# Patient Record
Sex: Male | Born: 1945
Health system: Southern US, Community
[De-identification: ages and names within clinical notes are randomized; demographics above are authoritative.]

## PROBLEM LIST (undated history)

## (undated) DIAGNOSIS — K219 Gastro-esophageal reflux disease without esophagitis: Secondary | ICD-10-CM

## (undated) DIAGNOSIS — N486 Induration penis plastica: Secondary | ICD-10-CM

## (undated) DIAGNOSIS — N529 Male erectile dysfunction, unspecified: Secondary | ICD-10-CM

## (undated) DIAGNOSIS — C439 Malignant melanoma of skin, unspecified: Secondary | ICD-10-CM

## (undated) DIAGNOSIS — E785 Hyperlipidemia, unspecified: Secondary | ICD-10-CM

## (undated) DIAGNOSIS — I10 Essential (primary) hypertension: Secondary | ICD-10-CM

## (undated) DIAGNOSIS — C61 Malignant neoplasm of prostate: Secondary | ICD-10-CM

## (undated) DIAGNOSIS — C4490 Unspecified malignant neoplasm of skin, unspecified: Secondary | ICD-10-CM

## (undated) HISTORY — DX: Male erectile dysfunction, unspecified: N52.9

## (undated) HISTORY — DX: Induration penis plastica: N48.6

## (undated) HISTORY — PX: OTHER SURGICAL HISTORY: SHX169

## (undated) HISTORY — DX: Hyperlipidemia, unspecified: E78.5

## (undated) HISTORY — PX: COLONOSCOPY: SHX174

## (undated) HISTORY — PX: POLYPECTOMY: SHX149

## (undated) HISTORY — DX: Gastro-esophageal reflux disease without esophagitis: K21.9

## (undated) HISTORY — DX: Essential (primary) hypertension: I10

## (undated) HISTORY — DX: Malignant melanoma of skin, unspecified: C43.9

---

## 2004-09-09 ENCOUNTER — Ambulatory Visit (HOSPITAL_COMMUNITY): Admission: RE | Admit: 2004-09-09 | Discharge: 2004-09-10 | Payer: Self-pay | Admitting: Urology

## 2005-09-07 ENCOUNTER — Ambulatory Visit: Payer: Self-pay | Admitting: Internal Medicine

## 2005-09-29 ENCOUNTER — Ambulatory Visit: Payer: Self-pay | Admitting: Internal Medicine

## 2010-02-06 HISTORY — PX: INGUINAL HERNIA REPAIR: SUR1180

## 2010-10-13 ENCOUNTER — Encounter: Payer: Self-pay | Admitting: Internal Medicine

## 2010-10-28 ENCOUNTER — Encounter (INDEPENDENT_AMBULATORY_CARE_PROVIDER_SITE_OTHER): Payer: Self-pay | Admitting: Surgery

## 2010-10-31 ENCOUNTER — Ambulatory Visit (INDEPENDENT_AMBULATORY_CARE_PROVIDER_SITE_OTHER): Payer: Medicare Other | Admitting: Surgery

## 2010-10-31 ENCOUNTER — Encounter (INDEPENDENT_AMBULATORY_CARE_PROVIDER_SITE_OTHER): Payer: Self-pay | Admitting: Surgery

## 2010-10-31 VITALS — BP 140/78 | HR 71 | Temp 97.4°F | Resp 12 | Ht 71.0 in | Wt 177.0 lb

## 2010-10-31 DIAGNOSIS — K4020 Bilateral inguinal hernia, without obstruction or gangrene, not specified as recurrent: Secondary | ICD-10-CM

## 2010-10-31 NOTE — Progress Notes (Signed)
Chief Complaint  Patient presents with  . Hernia    bilateral inguinal hernia    HPI Nicolas Tyler is a 65 y.o. male HPIVery pleasant gentleman referred by Dr. Retta Diones for evaluation of inguinal hernias. The patient has noticed a bulge in his right groin which has been easily reducing the last several months. It has becoming increasingly painful with sharp occasional pain. It does not refer anywhere else. He has not noticed a bulge in his left groin. He has had no obstructive symptoms  Past Medical History  Diagnosis Date  . Male erectile disorder   . Peyronie disease     Past Surgical History  Procedure Date  . Penis insertion     Family History  Problem Relation Age of Onset  . Heart disease Father     Social History History  Substance Use Topics  . Smoking status: Former Smoker    Quit date: 05/28/1974  . Smokeless tobacco: Not on file  . Alcohol Use: Yes    No Known Allergies  Current Outpatient Prescriptions  Medication Sig Dispense Refill  . aspirin 81 MG tablet Take 81 mg by mouth daily.        Marland Kitchen lisinopril (PRINIVIL,ZESTRIL) 10 MG tablet Take 10 mg by mouth daily.          Review of Systems Review of Systems  Constitutional: Negative.   HENT: Negative.   Eyes: Negative.   Respiratory: Negative.   Cardiovascular: Negative.   Gastrointestinal: Positive for abdominal pain.  Genitourinary: Negative.   Musculoskeletal: Negative.   Neurological: Negative.   Hematological: Negative.   Psychiatric/Behavioral: Negative.     Blood pressure 140/78, pulse 71, temperature 97.4 F (36.3 C), temperature source Temporal, resp. rate 12, height 5\' 11"  (1.803 m), weight 177 lb (80.287 kg).  Physical Exam Physical Exam  Constitutional: He is oriented to person, place, and time. He appears well-developed and well-nourished. No distress.  HENT:  Head: Normocephalic and atraumatic.  Right Ear: External ear normal.  Left Ear: External ear normal.  Nose: Nose  normal.  Mouth/Throat: Oropharynx is clear and moist. No oropharyngeal exudate.  Eyes: Conjunctivae are normal. Pupils are equal, round, and reactive to light. Left eye exhibits no discharge. No scleral icterus.  Neck: Normal range of motion. Neck supple. No tracheal deviation present. No thyromegaly present.  Cardiovascular: Normal rate, regular rhythm, normal heart sounds and intact distal pulses.   No murmur heard. Pulmonary/Chest: Effort normal and breath sounds normal. No respiratory distress. He has no wheezes.  Abdominal: Soft. Bowel sounds are normal. There is no tenderness. There is no rebound and no guarding.       Patient has bilateral easily reducible inguinal hernias. The left is larger than the right. He has a well-healed scar at the pubis.  Musculoskeletal: Normal range of motion. He exhibits no edema and no tenderness.  Lymphadenopathy:    He has no cervical adenopathy.  Neurological: He is alert and oriented to person, place, and time.  Skin: Skin is warm and dry. No rash noted. No erythema.  Psychiatric: His behavior is normal. Judgment normal.    Data Reviewed   Assessment    Patient with bilateral inguinal hernias    Plan    Repair of these hernias is recommended with mesh. I discussed the technique with him including the use of mesh. I cannot do this laparoscopically given his lower midline incision and his prosthetic pump. I will proceed with open repair with mesh. I discussed the  risk of surgery which include but are not limited to bleeding, infection, injury just running structures including the pump, nerve entrapment, chronic pain, recurrence. He understands and wishes to proceed.       Lynniah Janoski A 10/31/2010, 9:58 AM

## 2010-11-03 ENCOUNTER — Other Ambulatory Visit (INDEPENDENT_AMBULATORY_CARE_PROVIDER_SITE_OTHER): Payer: Self-pay | Admitting: Surgery

## 2010-11-03 ENCOUNTER — Encounter (HOSPITAL_COMMUNITY)
Admission: RE | Admit: 2010-11-03 | Discharge: 2010-11-03 | Disposition: A | Discharge status: Home / Self Care | Payer: Medicare Other | Source: Ambulatory Visit | Attending: Surgery | Admitting: Surgery

## 2010-11-03 DIAGNOSIS — K4020 Bilateral inguinal hernia, without obstruction or gangrene, not specified as recurrent: Secondary | ICD-10-CM

## 2010-11-03 LAB — CBC
HCT: 43.1 % (ref 39.0–52.0)
Hemoglobin: 14.2 g/dL (ref 13.0–17.0)
MCH: 29.1 pg (ref 26.0–34.0)
MCV: 88.3 fL (ref 78.0–100.0)
Platelets: 283 10*3/uL (ref 150–400)
RBC: 4.88 MIL/uL (ref 4.22–5.81)
RDW: 12.8 % (ref 11.5–15.5)

## 2010-11-03 LAB — BASIC METABOLIC PANEL
CO2: 27 mEq/L (ref 19–32)
Glucose, Bld: 98 mg/dL (ref 70–99)

## 2010-11-11 ENCOUNTER — Ambulatory Visit (HOSPITAL_COMMUNITY)
Admission: RE | Admit: 2010-11-11 | Discharge: 2010-11-11 | Disposition: A | Discharge status: Home / Self Care | Payer: Medicare Other | Source: Ambulatory Visit | Attending: Surgery | Admitting: Surgery

## 2010-11-11 DIAGNOSIS — K4020 Bilateral inguinal hernia, without obstruction or gangrene, not specified as recurrent: Secondary | ICD-10-CM

## 2010-11-11 DIAGNOSIS — Z01818 Encounter for other preprocedural examination: Secondary | ICD-10-CM | POA: Insufficient documentation

## 2010-11-11 DIAGNOSIS — Z01812 Encounter for preprocedural laboratory examination: Secondary | ICD-10-CM | POA: Insufficient documentation

## 2010-11-11 DIAGNOSIS — I10 Essential (primary) hypertension: Secondary | ICD-10-CM | POA: Insufficient documentation

## 2010-11-11 DIAGNOSIS — Z01810 Encounter for preprocedural cardiovascular examination: Secondary | ICD-10-CM | POA: Insufficient documentation

## 2010-11-11 DIAGNOSIS — F411 Generalized anxiety disorder: Secondary | ICD-10-CM | POA: Insufficient documentation

## 2010-11-11 DIAGNOSIS — M129 Arthropathy, unspecified: Secondary | ICD-10-CM | POA: Insufficient documentation

## 2010-11-16 NOTE — Op Note (Signed)
NAME:  Nicolas Tyler, Nicolas Tyler NO.:  [id number]  MEDICAL RECORD NO.:  [id number]  LOCATION:  [facility]                         FACILITY:  [facility]  PHYSICIAN:  Abigail Miyamoto, M.D. DATE OF BIRTH:  06-11-1945  DATE OF PROCEDURE:  11/11/2010 DATE OF DISCHARGE:                              OPERATIVE REPORT   PREOPERATIVE DIAGNOSIS:  Bilateral inguinal hernias.  POSTOPERATIVE DIAGNOSIS:  Bilateral inguinal hernias.  PROCEDURE:  Open bilateral inguinal hernia repair with mesh.  SURGEON:  Abigail Miyamoto, MD.  ANESTHESIA:  General and injectable Exparel.  ESTIMATED BLOOD LOSS:  Minimal.  FINDINGS:  The patient was found to have a small left direct inguinal hernia and a moderate-sized right indirect inguinal hernia.  Both were repaired with pieces of Prolene, ProGrip mesh.  PROCEDURE IN DETAIL:  The patient was brought to the operative, identified as Nicolas Tyler.  He was placed supine on the operating room table and general anesthesia was induced.  His abdomen was prepped and draped in usual sterile fashion.  Using a #15-blade, a small longitudinal incision was made in the patient's left groin with a #10 blade.  This was taken down through Scarpa fascia with electrocautery. The external oblique fascia was identified and opened with a scalpel further through the internal and external rings with Metzenbaum scissors.  The patient had significant scarring medially from his penile prosthesis.  I was able to finally free up the fascia in this area and identified his testicular cord.  The patient was found to have a small direct inguinal hernia.  I was able to easily reduce the sac and then brought a piece of ProGrip mesh onto the field.  I placed it around the cord structures and I was able to easily cover the inguinal floor.  The mesh stuck well to all surrounding tissue, so I decided to forego suture placement for fear of hitting his reservoir from the penile  prosthesis. At this point, I was then able to easily close the external oblique over the top of the mesh with a running 2-0 Vicryl suture.  I then anesthetized the fascia and performed the ilioinguinal nerve block with the injectable Exparel.  I then closed the Scarpa fascia with an interrupted 3-0 Vicryl sutures.  I anesthetized the skin further with Exparel and closed the skin with running 4-0 Monocryl.  Next, I made another longitudinal incision in the right groin, I took this down through Scarpa fascia with electrocautery.  I then again identify the external oblique fascia and opened it with a scalpel and further through the internal external ring with Metzenbaum scissors.  On this side, the patient had a much larger testicular cord and it became apparent that the patient's indirect hernia at this side.  I was able to control the cord structures with a Penrose drain.  I then identified the sac, was able to dissect down to its base.  I reduce all contents and tied off the base with a silk suture.  I then excised the redundant sac.  There was no evidence of direct inguinal hernia.  Again, I brought a piece of ProGrip mesh onto the field.  These are right-sided mesh for this side. I  then placed around the cord structures and secured it easily to the surrounding tissue with the mesh itself.  Good coverage inguinal floors and wrap around the cord appeared to be achieved.  Again I decided not to put the sutures in secondary to the patient's previous reservoir. Again, good coverage of the floor appeared to be achieved.  I then anesthetized the fascia and performed an ilioinguinal nerve block with Exparel.  I then closed the external oblique fascia with a running 2-0 Vicryl suture, closed the Scarpa fascia with interrupted 3-0 Vicryl sutures, and anesthetized the skin further with Exparel and then closed the skin with 4-0 Monocryl.  Steri-Strips, gauze, and tape were applied to both sides.   The patient tolerated the procedure well.  All counts were correct at the end of procedure.  The patient was then extubated in the operating and taken in stable condition to recovery room.     Abigail Miyamoto, M.D.     DB/MEDQ  D:  11/11/2010  T:  11/11/2010  Job:  [id number]  Electronically Signed by Abigail Miyamoto M.D. on 11/16/2010 12:05:51 PM

## 2010-11-22 ENCOUNTER — Encounter (INDEPENDENT_AMBULATORY_CARE_PROVIDER_SITE_OTHER): Payer: Self-pay | Admitting: General Surgery

## 2010-11-24 ENCOUNTER — Encounter (INDEPENDENT_AMBULATORY_CARE_PROVIDER_SITE_OTHER): Payer: Self-pay | Admitting: Surgery

## 2010-11-28 ENCOUNTER — Encounter (INDEPENDENT_AMBULATORY_CARE_PROVIDER_SITE_OTHER): Payer: Self-pay | Admitting: Surgery

## 2010-11-28 ENCOUNTER — Ambulatory Visit (INDEPENDENT_AMBULATORY_CARE_PROVIDER_SITE_OTHER): Payer: Medicare Other | Admitting: Surgery

## 2010-11-28 VITALS — BP 108/62 | HR 64 | Temp 97.2°F | Resp 16 | Ht 71.0 in | Wt 172.6 lb

## 2010-11-28 DIAGNOSIS — Z9889 Other specified postprocedural states: Secondary | ICD-10-CM

## 2010-11-28 DIAGNOSIS — Z8719 Personal history of other diseases of the digestive system: Secondary | ICD-10-CM

## 2010-11-28 NOTE — Progress Notes (Signed)
Subjective:     Patient ID: Nicolas Tyler, male   DOB: 12-Feb-1945, 65 y.o.   MRN: [id number]  HPI He is here for his first postoperative visit status post open bilateral inguinal hernia repairs with mesh.  He is doing well and has no complaints except for mild discomfort. Review of Systems     Objective:   Physical Exam    On exam, his incisions are healing well. There is no evidence of recurrence. There is only mild swelling Assessment:     Patient status post bilateral inguinal hernia repair with mesh    Plan:     He will refrain from heavy lifting for 2 more weeks. He will then resume normal activities. He will see me back as needed

## 2011-11-07 ENCOUNTER — Encounter: Payer: Self-pay | Admitting: Internal Medicine

## 2011-11-21 ENCOUNTER — Ambulatory Visit (AMBULATORY_SURGERY_CENTER): Payer: Medicare Other | Admitting: *Deleted

## 2011-11-21 VITALS — Ht 71.0 in | Wt 175.0 lb

## 2011-11-21 DIAGNOSIS — Z1211 Encounter for screening for malignant neoplasm of colon: Secondary | ICD-10-CM

## 2011-11-21 MED ORDER — MOVIPREP 100 G PO SOLR: ORAL | Status: DC

## 2011-12-01 ENCOUNTER — Ambulatory Visit (AMBULATORY_SURGERY_CENTER): Payer: Medicare Other | Admitting: Internal Medicine

## 2011-12-01 ENCOUNTER — Encounter: Payer: Self-pay | Admitting: Internal Medicine

## 2011-12-01 VITALS — BP 103/57 | HR 47 | Temp 98.0°F | Resp 19

## 2011-12-01 DIAGNOSIS — D126 Benign neoplasm of colon, unspecified: Secondary | ICD-10-CM

## 2011-12-01 DIAGNOSIS — Z86010 Personal history of colonic polyps: Secondary | ICD-10-CM

## 2011-12-01 DIAGNOSIS — Z1211 Encounter for screening for malignant neoplasm of colon: Secondary | ICD-10-CM

## 2011-12-01 MED ORDER — SODIUM CHLORIDE 0.9 % IV SOLN: 500.0000 mL | INTRAVENOUS | Status: DC

## 2011-12-01 NOTE — Op Note (Signed)
[facility] [address] [place], [address]   COLONOSCOPY PROCEDURE REPORT  PATIENT: Nicolas Tyler, Nicolas Tyler  MR#: [id number] BIRTHDATE: 12-29-1945 , 66  yrs. old GENDER: Male ENDOSCOPIST: Roxy Cedar, MD REFERRED XB:[id number] Program Recall PROCEDURE DATE:  12/01/2011 PROCEDURE:   Colonoscopy with snare polypectomy    x 1 ASA CLASS:   Class II INDICATIONS:patient's personal history of adenomatous colon polyps. Inesx exam 09-2005 w/ small adenomas MEDICATIONS: MAC sedation, administered by CRNA and propofol (Diprivan) 200mg  IV  DESCRIPTION OF PROCEDURE:   After the risks benefits and alternatives of the procedure were thoroughly explained, informed consent was obtained.  A digital rectal exam revealed no abnormalities of the rectum.   The LB CF-H180AL [id number]  endoscope was introduced through the anus and advanced to the cecum, which was identified by both the appendix and ileocecal valve. No adverse events experienced.   The quality of the prep was good, using MoviPrep  The instrument was then slowly withdrawn as the colon was fully examined.      COLON FINDINGS: A single polyp ranging between 3-34mm in size was found in the ascending colon.  A polypectomy was performed with a cold snare.  The resection was complete and the polyp tissue was completely retrieved.   Mild diverticulosis was noted in the ascending colon.   The colon mucosa was otherwise normal. Retroflexed views revealed internal hemorrhoids. The time to cecum=3 minutes 22 seconds.  Withdrawal time=13 minutes 22 seconds. The scope was withdrawn and the procedure completed. COMPLICATIONS: There were no complications.  ENDOSCOPIC IMPRESSION: 1.   Single polyp ranging between 3-74mm in size was found in the ascending colon; polypectomy was performed with a cold  snare 2.   Mild diverticulosis was noted in the ascending colon 3.   The colon mucosa was otherwise normal  RECOMMENDATIONS: 1.  Follow up colonoscopy in 5 years   eSigned:  Roxy Cedar, MD 12/01/2011 1:59 PM   cc: Rudi Heap, MD and The Patient   PATIENT NAME:  Nicolas Tyler, Nicolas Tyler MR#: [id number]

## 2011-12-01 NOTE — Patient Instructions (Addendum)

## 2011-12-01 NOTE — Progress Notes (Signed)
Patient did not experience any of the following events: a burn prior to discharge; a fall within the facility; wrong site/side/patient/procedure/implant event; or a hospital transfer or hospital admission upon discharge from the facility. (G8907) Patient did not have preoperative order for IV antibiotic SSI prophylaxis. (G8918)  

## 2011-12-04 ENCOUNTER — Telehealth: Payer: Self-pay | Admitting: *Deleted

## 2011-12-04 NOTE — Telephone Encounter (Signed)
  Follow up Call-  Call back number 12/01/2011  Post procedure Call Back phone  # [phone]  Permission to leave phone message Yes     Patient questions:  Do you have a fever, pain , or abdominal swelling? no Pain Score  0 *  Have you tolerated food without any problems? yes  Have you been able to return to your normal activities? yes  Do you have any questions about your discharge instructions: Diet   no Medications  no Follow up visit  no  Do you have questions or concerns about your Care? no  Actions: * If pain score is 4 or above: No action needed, pain <4. Wife states that husband has left for the morning but she answered all the questions and states that he has done well, no problems. ewm

## 2011-12-07 ENCOUNTER — Encounter: Payer: Self-pay | Admitting: Internal Medicine

## 2012-06-26 ENCOUNTER — Encounter: Payer: Self-pay | Admitting: Physician Assistant

## 2012-06-26 ENCOUNTER — Ambulatory Visit (INDEPENDENT_AMBULATORY_CARE_PROVIDER_SITE_OTHER): Payer: Medicare Other | Admitting: Physician Assistant

## 2012-06-26 VITALS — BP 127/68 | HR 61 | Temp 97.3°F | Ht 70.0 in | Wt 179.4 lb

## 2012-06-26 DIAGNOSIS — J029 Acute pharyngitis, unspecified: Secondary | ICD-10-CM

## 2012-06-26 DIAGNOSIS — I10 Essential (primary) hypertension: Secondary | ICD-10-CM | POA: Insufficient documentation

## 2012-06-26 DIAGNOSIS — E785 Hyperlipidemia, unspecified: Secondary | ICD-10-CM

## 2012-06-26 NOTE — Patient Instructions (Signed)
Viral Pharyngitis Viral pharyngitis is a viral infection that produces redness, pain, and swelling (inflammation) of the throat. It can spread from person to person (contagious). CAUSES Viral pharyngitis is caused by inhaling a large amount of certain germs called viruses. Many different viruses cause viral pharyngitis. SYMPTOMS Symptoms of viral pharyngitis include:  Sore throat.  Tiredness.  Stuffy nose.  Low-grade fever.  Congestion.  Cough. TREATMENT Treatment includes rest, drinking plenty of fluids, and the use of over-the-counter medication (approved by your caregiver). HOME CARE INSTRUCTIONS   Drink enough fluids to keep your urine clear or pale yellow.  Eat soft, cold foods such as ice cream, frozen ice pops, or gelatin dessert.  Gargle with warm salt water (1 tsp salt per 1 qt of water).  If over age 7, throat lozenges may be used safely.  Only take over-the-counter or prescription medicines for pain, discomfort, or fever as directed by your caregiver. Do not take aspirin. To help prevent spreading viral pharyngitis to others, avoid:  Mouth-to-mouth contact with others.  Sharing utensils for eating and drinking.  Coughing around others. SEEK MEDICAL CARE IF:   You are better in a few days, then become worse.  You have a fever or pain not helped by pain medicines.  There are any other changes that concern you. Document Released: 11/02/2004 Document Revised: 04/17/2011 Document Reviewed: 03/31/2010 Exit[employer] Patient Information 2014 [employer].  

## 2012-06-26 NOTE — Progress Notes (Signed)
Subjective:     Patient ID: Nicolas Tyler, male   DOB: 1946/01/10, 67 y.o.   MRN: [id number]  HPI Pt with a 2 day hx of general malaise , chills, and sl S/T He actually feels better today He is concerned since multiple FH recently dx'd with strep Pt would like to make sure he does not have it  Review of Systems  All other systems reviewed and are negative.       Objective:   Physical Exam  Nursing note and vitals reviewed. Constitutional: He appears well-developed and well-nourished.  HENT:  Right Ear: External ear normal.  Left Ear: External ear normal.  Nose: Nose normal.  Mouth/Throat: Oropharynx is clear and moist.  Neck: Neck supple. No tracheal deviation present.  Cardiovascular: Normal rate, regular rhythm and normal heart sounds.   Pulmonary/Chest: Effort normal and breath sounds normal.  Lymphadenopathy:    He has no cervical adenopathy.       Assessment:     Pharyngitis    Plan:     Since sx have improved conserv. tx   Rest Fluids F/U prn

## 2012-07-26 ENCOUNTER — Ambulatory Visit (INDEPENDENT_AMBULATORY_CARE_PROVIDER_SITE_OTHER): Payer: Medicare Other | Admitting: Physician Assistant

## 2012-07-26 VITALS — BP 132/62 | HR 74 | Temp 97.6°F | Ht 70.0 in | Wt 178.0 lb

## 2012-07-26 DIAGNOSIS — S0501XA Injury of conjunctiva and corneal abrasion without foreign body, right eye, initial encounter: Secondary | ICD-10-CM

## 2012-07-26 DIAGNOSIS — S058X9A Other injuries of unspecified eye and orbit, initial encounter: Secondary | ICD-10-CM

## 2012-07-26 MED ORDER — ERYTHROMYCIN 5 MG/GM OP OINT: TOPICAL_OINTMENT | Freq: Every day | OPHTHALMIC | Status: DC

## 2012-07-26 NOTE — Patient Instructions (Signed)
Wear patch on affected eye during daytime x 7 days. Use warm moist compresses up to 3 times daily.

## 2012-07-26 NOTE — Progress Notes (Signed)
  Subjective:    Patient ID: Nicolas Tyler, male    DOB: 01/26/1946, 67 y.o.   MRN: [id number]  HPI 67 y/o male w/ c/o blurred vision and Right eye irritation x 2 days after mowing hay and exposure to dust and environmental irritants. Patient has tried warm, moist compresses with some relief and wore an eye patch last night that has seemed to alleviate symptoms. Irritation is worse with light. Discomfort seems to be improving.     Review of Systems  Constitutional: Negative for fever and chills.  HENT: Negative for facial swelling.   Eyes: Positive for pain (intermittent but improving), redness, itching and visual disturbance (blurred vision in right eye). Negative for discharge. Photophobia: irritaiton worse with light exposure.  Skin: Positive for color change (1+ erythema and edema on R orbital rim).   Admits intermittent R eye pain that is improving since 2 days ago. +Blurred vision, feeling of "scratching"     Objective:   Physical Exam  Eyes: No foreign bodies found. Right eye exhibits no discharge. No foreign body present in the right eye. Left eye exhibits no discharge. No foreign body present in the left eye. Right conjunctiva is injected. Left conjunctiva is not injected. Right pupil is round and reactive. Left pupil is round and reactive. Pupils are equal.    After staining and eye examination, there appears to be a corneal abrasion on the 9:00 positon on the right eye. ( as shown in picture). No TTP of orbital rim or temporal region.         Assessment & Plan:  1. Corneal Abrasion: Erythromycin Ophthalmic drops q 3-4 hrs until symtpoms improve. Protect eye with patch x 7 days. Use warm compresses 2-3 x daily. RTC if symptoms worsen or do not improve. If eye pain or vision worsens, go to the ER for further evaluation and treatment.

## 2012-07-27 ENCOUNTER — Ambulatory Visit (INDEPENDENT_AMBULATORY_CARE_PROVIDER_SITE_OTHER): Payer: Medicare Other | Admitting: Nurse Practitioner

## 2012-07-27 ENCOUNTER — Encounter: Payer: Self-pay | Admitting: Nurse Practitioner

## 2012-07-27 DIAGNOSIS — H05019 Cellulitis of unspecified orbit: Secondary | ICD-10-CM

## 2012-07-27 MED ORDER — CIPROFLOXACIN HCL 500 MG PO TABS: 500.0000 mg | ORAL_TABLET | Freq: Two times a day (BID) | ORAL | Status: DC

## 2012-07-27 MED ORDER — CEFTRIAXONE SODIUM 1 G IJ SOLR
1.0000 g | INTRAMUSCULAR | Status: AC
  Administered 2012-07-27: 1 g via INTRAMUSCULAR

## 2012-07-27 NOTE — Patient Instructions (Signed)
Periorbital Cellulitis °Periorbital cellulitis is a common infection that can affect the eyelid and the soft tissues that surround the eyeball. The infection may also affect the structures that produce and drain tears. It does not affect the eyeball itself. Natural tissue barriers usually prevent the spread of this infection to the eyeball and other deeper areas of the eye socket.      °CAUSES °· Bacterial infection. °· Long-term (chronic) sinus infections. °· An object (foreign body) stuck behind the eye. °· An injury that goes through the eyelid tissues. °· An injury that causes an infection, such as an insect sting. °· Fracture of the bone around the eye. °· Infections which have spread from the eyelid or other structures around the eye. °· Bite wounds. °· Inflammation or infection of the lining membranes of the brain (meningitis). °· An infection in the blood (septicemia). °· Dental infection (abscess). °· Viral infection (this is rare). °SYMPTOMS °Symptoms usually come on suddenly. °· Pain in the eye. °· Red, hot, and swollen eyelids and possibly cheeks. The swelling is sometimes bad enough that the eyelids cannot open. Some infections make the eyelids look purple. °· Fever and feeling generally ill. °· Pain when touching the area around the eye. °DIAGNOSIS  °Periorbital cellulitis can be diagnosed from an eye exam. In severe cases, your caregiver might suggest: °· Blood tests. °· Imaging tests (such as a CT scan) to examine the sinuses and the area around and behind the eyeball. °TREATMENT °If your caregiver feels that you do not have any signs of serious infection, treatment may include: °· Antibiotics. °· Nasal decongestants to reduce swelling. °· Referral to a dentist if it is suspected that the infection was caused by a prior tooth infection. °· Examination every day to make sure the problem is improving. °HOME CARE INSTRUCTIONS °· Take your antibiotics as directed. Finish them even if you start to feel  better. °· Some pain is normal with this condition. Take pain medicine as directed by your caregiver. Only take pain medicines approved by your caregiver. °· It is important to drink fluids. Drink enough water and fluids to keep your urine clear or pale yellow. °· Do not smoke. °· Rest and get plenty of sleep. °· Mild or moderate fevers generally have no long-term effects and often do not require treatment. °· If your caregiver has given you a follow-up appointment, it is very important to keep that appointment. Your caregiver will need to make sure that the infection is getting better. It is important to check that a more serious infection is not developing. °SEEK IMMEDIATE MEDICAL CARE IF: °· Your eyelids become more painful, red, warm, or swollen. °· You develop double vision or your vision becomes blurred or worsens in any way. °· You have trouble moving your eyes. °· The eye looks like it is popping out (proptosis). °· You develop a severe headache, severe neck pain, or neck stiffness. °· You develop repeated vomiting. °· You have a fever or persistent symptoms for more than 72 hours. °· You have a fever and your symptoms suddenly get worse. °MAKE SURE YOU: °· Understand these instructions. °· Will watch your condition. °· Will get help right away if you are not doing well or get worse. °Document Released: 02/25/2010 Document Revised: 04/17/2011 Document Reviewed: 02/25/2010 °[employer]® Patient Information ©2014 [employer]. ° °

## 2012-07-27 NOTE — Progress Notes (Signed)
  Subjective:    Patient ID: Nicolas Tyler, male    DOB: Jul 04, 1945, 67 y.o.   MRN: [id number]  HPI Patient was in office yesterday with right eye pain- Saw T.Gann- she stained eye and cleaned it out with saline- Found some corneal abrasions and was given a ointment that was given to him- Tis morning he woke up with his upper and lower lid edema.    Review of Systems  Eyes: Positive for photophobia, discharge and redness.  All other systems reviewed and are negative.       Objective:   Physical Exam  Constitutional: He appears well-developed and well-nourished.  Eyes: EOM are normal. Pupils are equal, round, and reactive to light. Right eye exhibits discharge (watery).  Mild scleral injection Orbital edema Warm to touch  Cardiovascular: Normal rate, regular rhythm and normal heart sounds.   Pulmonary/Chest: Effort normal and breath sounds normal.    BP 108/61  Pulse 73  Temp(Src) 99.2 F (37.3 C) (Oral)  Ht 5\' 11"  (1.803 m)  Wt 174 lb (78.926 kg)  BMI 24.28 kg/m2       Assessment & Plan:  1. Periorbital cellulitis, right Cool compresses Continue eye ointment as rx Avoid rubbing eye Call provider tomorrow and let me know how it is doing If worsen  Go to ER - cefTRIAXone (ROCEPHIN) injection 1 g; Inject 1 g into the muscle now. - ciprofloxacin (CIPRO) 500 MG tablet; Take 1 tablet (500 mg total) by mouth 2 (two) times daily.  Dispense: 20 tablet; Refill: 0  Mary-Margaret Daphine Deutscher, FNP

## 2012-07-28 ENCOUNTER — Emergency Department (HOSPITAL_COMMUNITY)
Admission: EM | Admit: 2012-07-28 | Discharge: 2012-07-28 | Disposition: A | Discharge status: Home / Self Care | Payer: Medicare Other | Attending: Emergency Medicine | Admitting: Emergency Medicine

## 2012-07-28 ENCOUNTER — Encounter (HOSPITAL_COMMUNITY): Payer: Self-pay | Admitting: Nurse Practitioner

## 2012-07-28 DIAGNOSIS — Z862 Personal history of diseases of the blood and blood-forming organs and certain disorders involving the immune mechanism: Secondary | ICD-10-CM | POA: Insufficient documentation

## 2012-07-28 DIAGNOSIS — Z7982 Long term (current) use of aspirin: Secondary | ICD-10-CM | POA: Insufficient documentation

## 2012-07-28 DIAGNOSIS — Z87448 Personal history of other diseases of urinary system: Secondary | ICD-10-CM | POA: Insufficient documentation

## 2012-07-28 DIAGNOSIS — Z79899 Other long term (current) drug therapy: Secondary | ICD-10-CM | POA: Insufficient documentation

## 2012-07-28 DIAGNOSIS — H05019 Cellulitis of unspecified orbit: Secondary | ICD-10-CM | POA: Insufficient documentation

## 2012-07-28 DIAGNOSIS — Z87891 Personal history of nicotine dependence: Secondary | ICD-10-CM | POA: Insufficient documentation

## 2012-07-28 DIAGNOSIS — L03213 Periorbital cellulitis: Secondary | ICD-10-CM

## 2012-07-28 DIAGNOSIS — Z87828 Personal history of other (healed) physical injury and trauma: Secondary | ICD-10-CM | POA: Insufficient documentation

## 2012-07-28 DIAGNOSIS — Z85820 Personal history of malignant melanoma of skin: Secondary | ICD-10-CM | POA: Insufficient documentation

## 2012-07-28 DIAGNOSIS — Z8639 Personal history of other endocrine, nutritional and metabolic disease: Secondary | ICD-10-CM | POA: Insufficient documentation

## 2012-07-28 DIAGNOSIS — I10 Essential (primary) hypertension: Secondary | ICD-10-CM | POA: Insufficient documentation

## 2012-07-28 MED ORDER — CEPHALEXIN 500 MG PO CAPS: 500.0000 mg | ORAL_CAPSULE | Freq: Four times a day (QID) | ORAL | Status: DC

## 2012-07-28 MED ORDER — DEXTROSE 5 % IV SOLN
1.0000 g | Freq: Once | INTRAVENOUS | Status: AC
  Administered 2012-07-28: 1 g via INTRAVENOUS
  Filled 2012-07-28: qty 10

## 2012-07-28 MED ORDER — SULFAMETHOXAZOLE-TRIMETHOPRIM 800-160 MG PO TABS: 1.0000 | ORAL_TABLET | Freq: Two times a day (BID) | ORAL | Status: DC

## 2012-07-28 NOTE — ED Provider Notes (Signed)
History     CSN: [id number]  Arrival date & time 07/28/12  1211   First MD Initiated Contact with Patient 07/28/12 1217      Chief Complaint  Patient presents with  . Eye Injury    (Consider location/radiation/quality/duration/timing/severity/associated sxs/prior treatment) HPI Comments: Patient presents to the emergency department with chief complaint of right sided facial swelling. Patient was seen 2 days ago by his primary care Dr., and was diagnosed with corneal abrasion. He was given erythromycin ophthalmic ointment. He is been using this consistently. He states that yesterday, he noticed swelling and redness around the eye. He again followed up with his primary care Dr., and was given Rocephin and Cipro. He reports only mild improvement. He denies fevers, chills, nausea, or vomiting. Denies eye pain. No pain with eye movement. No photophobia or phonophobia.  The history is provided by the patient. No language interpreter was used.    Past Medical History  Diagnosis Date  . Male erectile disorder   . Peyronie disease   . Hyperlipidemia   . Hypertension   . Melanoma     Past Surgical History  Procedure Laterality Date  . Penis insertion    . Inguinal hernia repair  2012    Family History  Problem Relation Age of Onset  . Heart disease Father   . Coronary artery disease Mother     History  Substance Use Topics  . Smoking status: Former Smoker    Quit date: 05/28/1974  . Smokeless tobacco: Never Used  . Alcohol Use: 2.4 oz/week    4 Cans of beer per week      Review of Systems  All other systems reviewed and are negative.    Allergies  Review of patient's allergies indicates no known allergies.  Home Medications   Current Outpatient Rx  Name  Route  Sig  Dispense  Refill  . aspirin 81 MG tablet   Oral   Take 81 mg by mouth daily.           . ciprofloxacin (CIPRO) 500 MG tablet   Oral   Take 1 tablet (500 mg total) by mouth 2 (two) times  daily.   20 tablet   0   . erythromycin ophthalmic ointment   Right Eye   Place into the right eye 6 (six) times daily. Apply to affected eye every 3-4 hours as needed until symptoms improve.   3.5 g   0   . lisinopril (PRINIVIL,ZESTRIL) 10 MG tablet   Oral   Take 10 mg by mouth daily.           . Multiple Vitamins-Minerals (MULTIVITAMIN WITH MINERALS) tablet   Oral   Take 1 tablet by mouth daily.           BP 131/81  Pulse 58  Temp(Src) 97 F (36.1 C) (Oral)  Resp 16  SpO2 100%  Physical Exam  Nursing note and vitals reviewed. Constitutional: He is oriented to person, place, and time. He appears well-developed and well-nourished.  HENT:  Head: Normocephalic and atraumatic.    Right-sided facial swelling and redness, consistent with a periorbital/preseptal cellulitis. No signs of discharge or abscess  Eyes: Conjunctivae and EOM are normal. Pupils are equal, round, and reactive to light. Right eye exhibits no discharge. Left eye exhibits no discharge. No scleral icterus.  Mildly injected right conjunctiva, no active discharge, no pain with eye movement, no obvious foreign bodies.  Neck: Normal range of motion. Neck supple. No JVD  present.  Cardiovascular: Normal rate, regular rhythm, normal heart sounds and intact distal pulses.  Exam reveals no gallop and no friction rub.   No murmur heard. Pulmonary/Chest: Effort normal and breath sounds normal. No respiratory distress. He has no wheezes. He has no rales. He exhibits no tenderness.  Abdominal: Soft. Bowel sounds are normal. He exhibits no distension and no mass. There is no tenderness. There is no rebound and no guarding.  Musculoskeletal: Normal range of motion. He exhibits no edema and no tenderness.  Neurological: He is alert and oriented to person, place, and time. He has normal reflexes.  CN 3-12 intact  Skin: Skin is warm and dry.  Psychiatric: He has a normal mood and affect. His behavior is normal. Judgment  and thought content normal.    ED Course  Procedures (including critical care time)  Labs Reviewed - No data to display No results found.   1. Periorbital cellulitis of right eye       MDM  The patient with skin infection of the face. Concern for periorbital cellulitis. Does not appear to involve the eye. No pain with eye movement. No consensual photophobia. No active discharge. No signs abscesses the   12:43 PM Patient seen by and discussed with Dr. Rhunette Croft, who recommends that we treat the patient with IV ceftriaxone, and then discharge to home. CT not indicated at this time. Will discontinue Cipro, and will start Bactrim and Keflex. Will treat periorbital/preseptal cellulitis. Specific return precautions given.  Patient has finished IV antibiotics. He is stable and ready for discharge.     Roxy Horseman, PA-C 07/28/12 1338

## 2012-07-28 NOTE — ED Notes (Signed)
Pt got debris in eye past week, was seen by opthamologist 2x this week and treated for corneal abrasion with oral abx and topical creams. Reports swelling continues to get worse so opthamologist encouraged pt to come to ed.

## 2012-07-28 NOTE — ED Notes (Signed)
Pt has right eye and facial swelling and redness. PA in to evaluate.

## 2012-07-30 NOTE — ED Provider Notes (Signed)
Medical screening examination/treatment/procedure(s) were conducted as a shared visit with non-physician practitioner(s) and myself.  I personally evaluated the patient during the encounter  Pt seen for cellulitis of the face. Orbital cellulitis ruled out per hx. No clinical concerns for deep abscess of the face. Pt has a pcp and is amenable to close f/u and is reliable. Will d/c. Antibiotics changed during this visit to improve coverage.   Derwood Kaplan, MD 07/30/12 [phone]

## 2012-07-31 ENCOUNTER — Telehealth: Payer: Self-pay | Admitting: Nurse Practitioner

## 2012-07-31 NOTE — Telephone Encounter (Signed)
ok 

## 2012-07-31 NOTE — Telephone Encounter (Signed)
Pt was given IV antibiotics at ER for his swollen face this past week end.  His face has less swelling but his vision on the right side was still blurred. He is due for a vision check so he will pursue an appointment to evaluate this and possibly new glasses. He will call us to schedule a follow up this week.

## 2012-11-07 ENCOUNTER — Other Ambulatory Visit: Payer: Self-pay | Admitting: *Deleted

## 2012-11-07 MED ORDER — LISINOPRIL 10 MG PO TABS: 10.0000 mg | ORAL_TABLET | Freq: Every day | ORAL | Status: DC

## 2013-01-14 ENCOUNTER — Other Ambulatory Visit: Payer: Self-pay | Admitting: Family Medicine

## 2013-01-17 NOTE — Telephone Encounter (Signed)
Last seen 07/27/12  MMM

## 2013-03-11 ENCOUNTER — Other Ambulatory Visit: Payer: Self-pay | Admitting: Nurse Practitioner

## 2013-04-09 ENCOUNTER — Other Ambulatory Visit: Payer: Self-pay | Admitting: Nurse Practitioner

## 2013-04-16 ENCOUNTER — Ambulatory Visit (INDEPENDENT_AMBULATORY_CARE_PROVIDER_SITE_OTHER): Payer: Medicare Other | Admitting: Nurse Practitioner

## 2013-04-16 ENCOUNTER — Encounter: Payer: Self-pay | Admitting: Nurse Practitioner

## 2013-04-16 VITALS — BP 131/78 | HR 51 | Temp 98.5°F | Ht 71.0 in | Wt 184.2 lb

## 2013-04-16 DIAGNOSIS — I10 Essential (primary) hypertension: Secondary | ICD-10-CM

## 2013-04-16 MED ORDER — LISINOPRIL 10 MG PO TABS: ORAL_TABLET | ORAL | Status: DC

## 2013-04-16 NOTE — Patient Instructions (Signed)

## 2013-04-16 NOTE — Progress Notes (Signed)
   Subjective:    Patient ID: Nicolas Tyler, male    DOB: 1945/10/01, 68 y.o.   MRN: [id number]  Hypertension This is a chronic problem. The current episode started more than 1 year ago. The problem has been gradually improving since onset. The problem is controlled. Pertinent negatives include no blurred vision, chest pain, palpitations, peripheral edema or shortness of breath. Risk factors for coronary artery disease include male gender and dyslipidemia. Past treatments include ACE inhibitors. The current treatment provides moderate improvement. There are no compliance problems.   Hyperlipidemia The current episode started more than 1 year ago. Pertinent negatives include no chest pain or shortness of breath.   Patient presents today for follow-up of chronic problems. No new complaints or problems.   Review of Systems  Eyes: Negative for blurred vision.  Respiratory: Negative for shortness of breath.   Cardiovascular: Negative for chest pain and palpitations.  Gastrointestinal: Negative for constipation.  Psychiatric/Behavioral: Negative for sleep disturbance.  All other systems reviewed and are negative.       Objective:   Physical Exam  Constitutional: He is oriented to person, place, and time. He appears well-developed and well-nourished.  HENT:  Head: Normocephalic.  Right Ear: External ear normal.  Left Ear: External ear normal.  Nose: Nose normal.  Mouth/Throat: Oropharynx is clear and moist.  Eyes: Pupils are equal, round, and reactive to light.  Neck: Normal range of motion. Neck supple. No JVD present. No thyromegaly present.  Cardiovascular: Normal rate, regular rhythm, normal heart sounds and intact distal pulses.  Exam reveals no gallop and no friction rub.   No murmur heard. Pulmonary/Chest: Effort normal and breath sounds normal. No respiratory distress. He has no wheezes. He has no rales. He exhibits no tenderness.  Abdominal: Soft. Bowel sounds are normal. He  exhibits no mass. There is no tenderness.  Musculoskeletal: Normal range of motion. He exhibits no edema.  Lymphadenopathy:    He has no cervical adenopathy.  Neurological: He is alert and oriented to person, place, and time. No cranial nerve deficit.  Skin: Skin is warm and dry.  Psychiatric: He has a normal mood and affect. His behavior is normal. Judgment and thought content normal.   BP 131/78  Pulse 51  Temp(Src) 98.5 F (36.9 C) (Oral)  Ht 5\' 11"  (1.803 m)  Wt 184 lb 3.2 oz (83.553 kg)  BMI 25.70 kg/m2        Assessment & Plan:   1. HTN (hypertension)     Meds ordered this encounter  Medications  . lisinopril (PRINIVIL,ZESTRIL) 10 MG tablet    Sig: TAKE ONE TABLET BY MOUTH ONCE A DAY    Dispense:  30 tablet    Refill:  5    Order Specific Question:  Supervising Provider    Answer:  Chipper Herb [[id number]]    Patient has annual exam scheduled for April - Will do labs then since patient was non-fasting today Health maintenance reviewed Diet and exercise encouraged Continue all meds Follow up  In 6 months   [place], FNP

## 2013-05-27 ENCOUNTER — Ambulatory Visit (INDEPENDENT_AMBULATORY_CARE_PROVIDER_SITE_OTHER): Payer: Medicare Other

## 2013-05-27 ENCOUNTER — Encounter: Payer: Self-pay | Admitting: Nurse Practitioner

## 2013-05-27 ENCOUNTER — Ambulatory Visit (INDEPENDENT_AMBULATORY_CARE_PROVIDER_SITE_OTHER): Payer: Medicare Other | Admitting: Nurse Practitioner

## 2013-05-27 VITALS — BP 111/71 | HR 85 | Temp 97.7°F | Ht 71.0 in | Wt 181.8 lb

## 2013-05-27 DIAGNOSIS — Z23 Encounter for immunization: Secondary | ICD-10-CM

## 2013-05-27 DIAGNOSIS — Z87891 Personal history of nicotine dependence: Secondary | ICD-10-CM

## 2013-05-27 DIAGNOSIS — I10 Essential (primary) hypertension: Secondary | ICD-10-CM

## 2013-05-27 DIAGNOSIS — E785 Hyperlipidemia, unspecified: Secondary | ICD-10-CM

## 2013-05-27 DIAGNOSIS — Z0000 Encounter for general adult medical examination without abnormal findings: Secondary | ICD-10-CM

## 2013-05-27 DIAGNOSIS — M5430 Sciatica, unspecified side: Secondary | ICD-10-CM

## 2013-05-27 DIAGNOSIS — M5431 Sciatica, right side: Secondary | ICD-10-CM

## 2013-05-27 LAB — POCT CBC
Granulocyte percent: 73.7 %G (ref 37–80)
HEMATOCRIT: 41.3 % — AB (ref 43.5–53.7)
Hemoglobin: 13.2 g/dL — AB (ref 14.1–18.1)
Lymph, poc: 2.3 (ref 0.6–3.4)
MCH, POC: 27.8 pg (ref 27–31.2)
MCHC: 31.9 g/dL (ref 31.8–35.4)
MCV: 87.3 fL (ref 80–97)
MPV: 7.4 fL (ref 0–99.8)
POC GRANULOCYTE: 7.6 — AB (ref 2–6.9)
POC LYMPH %: 22.3 % (ref 10–50)
Platelet Count, POC: 301 10*3/uL (ref 142–424)
RBC: 4.7 M/uL (ref 4.69–6.13)
RDW, POC: 13.5 %
WBC: 10.3 10*3/uL — AB (ref 4.6–10.2)

## 2013-05-27 MED ORDER — LISINOPRIL 10 MG PO TABS: ORAL_TABLET | ORAL | Status: DC

## 2013-05-27 NOTE — Progress Notes (Signed)
Subjective:    Patient ID: Nicolas Tyler, male    DOB: March 23, 1945, 68 y.o.   MRN: [id number]  Patient in today for annual physical exam- doing well- Has a sensative spot of cheek when he shaves- He also says when he is driving he has a pain in his right hip that radiates down his leg past his knee- this pain has been going on for years. Hypertension This is a chronic problem. The current episode started more than 1 year ago. The problem has been gradually improving since onset. The problem is controlled. Pertinent negatives include no blurred vision, chest pain, palpitations, peripheral edema or shortness of breath. Risk factors for coronary artery disease include male gender and dyslipidemia. Past treatments include ACE inhibitors. The current treatment provides moderate improvement. There are no compliance problems.   Hyperlipidemia The current episode started more than 1 year ago. Pertinent negatives include no chest pain or shortness of breath. He is currently on no antihyperlipidemic treatment. There are no compliance problems.  Risk factors for coronary artery disease include dyslipidemia, hypertension and male sex.     Review of Systems  Eyes: Negative for blurred vision.  Respiratory: Negative for shortness of breath.   Cardiovascular: Negative for chest pain and palpitations.  Gastrointestinal: Negative for constipation.  Psychiatric/Behavioral: Negative for sleep disturbance.  All other systems reviewed and are negative.      Objective:   Physical Exam  Constitutional: He is oriented to person, place, and time. He appears well-developed and well-nourished.  HENT:  Head: Normocephalic.  Right Ear: External ear normal.  Left Ear: External ear normal.  Nose: Nose normal.  Mouth/Throat: Oropharynx is clear and moist.  Eyes: Pupils are equal, round, and reactive to light.  Neck: Normal range of motion. Neck supple. No JVD present. No thyromegaly present.  Cardiovascular:  Normal rate, regular rhythm, normal heart sounds and intact distal pulses.  Exam reveals no gallop and no friction rub.   No murmur heard. Pulmonary/Chest: Effort normal and breath sounds normal. No respiratory distress. He has no wheezes. He has no rales. He exhibits no tenderness.  Abdominal: Soft. Bowel sounds are normal. He exhibits no mass. There is no tenderness.  Musculoskeletal: Normal range of motion. He exhibits no edema.  Lymphadenopathy:    He has no cervical adenopathy.  Neurological: He is alert and oriented to person, place, and time. No cranial nerve deficit.  Skin: Skin is warm and dry.  Psychiatric: He has a normal mood and affect. His behavior is normal. Judgment and thought content normal.   BP 111/71  Pulse 85  Temp(Src) 97.7 F (36.5 C) (Oral)  Ht '5\' 11"'  (1.803 m)  Wt 181 lb 12.8 oz (82.464 kg)  BMI 25.37 kg/m2  Chest x ray- normal-Preliminary reading by Ronnald Collum, FNP  [facility]  EKG-NSR- reading by Ronnald Collum, FNP  [facility]       Assessment & Plan:   1. HTN (hypertension)   2. Hyperlipidemia LDL goal < 100   3. Sciatica of right side without back pain   4. Former smoker   5. Annual physical exam    Orders Placed This Encounter  Procedures  . DG Chest 2 View    Standing Status: Future     Number of Occurrences:      Standing Expiration Date: 07/27/2014    Order Specific Question:  Reason for Exam (SYMPTOM  OR DIAGNOSIS REQUIRED)    Answer:  smoker former    Order Specific Question:  Preferred imaging location?    Answer:  Internal  . CMP14+EGFR  . NMR, lipoprofile  . PSA, total and free  . POCT CBC  . EKG 12-Lead   Meds ordered this encounter  Medications  . lisinopril (PRINIVIL,ZESTRIL) 10 MG tablet    Sig: TAKE ONE TABLET BY MOUTH ONCE A DAY    Dispense:  30 tablet    Refill:  5    Order Specific Question:  Supervising Provider    Answer:  Joycelyn Man   motin or tylenol before driving for extended periods of time- stop frequently  to rest if starts hurting Labs pending Health maintenance reviewed Diet and exercise encouraged Continue all meds Follow up  In 6 months   [place], FNP

## 2013-05-27 NOTE — Patient Instructions (Signed)

## 2013-05-28 ENCOUNTER — Encounter: Payer: Self-pay | Admitting: *Deleted

## 2013-05-28 LAB — PSA, TOTAL AND FREE
PSA FREE: 0.75 ng/mL
PSA, Free Pct: 19.2 %
PSA: 3.9 ng/mL (ref 0.0–4.0)

## 2013-05-28 LAB — CMP14+EGFR
A/G RATIO: 1.7 (ref 1.1–2.5)
ALT: 15 IU/L (ref 0–44)
AST: 25 IU/L (ref 0–40)
Albumin: 4.5 g/dL (ref 3.6–4.8)
Alkaline Phosphatase: 78 IU/L (ref 39–117)
BILIRUBIN TOTAL: 0.4 mg/dL (ref 0.0–1.2)
BUN/Creatinine Ratio: 24 — ABNORMAL HIGH (ref 10–22)
BUN: 21 mg/dL (ref 8–27)
CO2: 25 mmol/L (ref 18–29)
Calcium: 9.5 mg/dL (ref 8.6–10.2)
Chloride: 101 mmol/L (ref 97–108)
Creatinine, Ser: 0.89 mg/dL (ref 0.76–1.27)
GFR, EST AFRICAN AMERICAN: 101 mL/min/{1.73_m2} (ref >59)
GFR, EST NON AFRICAN AMERICAN: 87 mL/min/{1.73_m2} (ref >59)
Globulin, Total: 2.7 g/dL (ref 1.5–4.5)
Glucose: 84 mg/dL (ref 65–99)
POTASSIUM: 4 mmol/L (ref 3.5–5.2)
SODIUM: 140 mmol/L (ref 134–144)
TOTAL PROTEIN: 7.2 g/dL (ref 6.0–8.5)

## 2013-05-28 LAB — NMR, LIPOPROFILE
Cholesterol: 191 mg/dL (ref <200)
HDL Cholesterol by NMR: 58 mg/dL (ref >=40)
HDL Particle Number: 33.9 umol/L (ref >=30.5)
LDL Particle Number: 1155 nmol/L — ABNORMAL HIGH (ref <1000)
LDL SIZE: 20.9 nm (ref >20.5)
LDLC SERPL CALC-MCNC: 113 mg/dL — ABNORMAL HIGH (ref <100)
SMALL LDL PARTICLE NUMBER: 320 nmol/L (ref <=527)
Triglycerides by NMR: 100 mg/dL (ref <150)

## 2013-05-28 NOTE — Progress Notes (Signed)
Quick Note:  Copy of labs sent to patient ______ 

## 2013-11-11 ENCOUNTER — Ambulatory Visit (INDEPENDENT_AMBULATORY_CARE_PROVIDER_SITE_OTHER): Payer: Medicare Other | Admitting: Nurse Practitioner

## 2013-11-11 ENCOUNTER — Encounter: Payer: Self-pay | Admitting: Nurse Practitioner

## 2013-11-11 VITALS — BP 152/78 | HR 84 | Temp 99.3°F | Ht 71.0 in | Wt 190.0 lb

## 2013-11-11 DIAGNOSIS — J209 Acute bronchitis, unspecified: Secondary | ICD-10-CM

## 2013-11-11 MED ORDER — METHYLPREDNISOLONE ACETATE 80 MG/ML IJ SUSP
80.0000 mg | Freq: Once | INTRAMUSCULAR | Status: AC
  Administered 2013-11-11: 80 mg via INTRAMUSCULAR

## 2013-11-11 MED ORDER — HYDROCODONE-HOMATROPINE 5-1.5 MG/5ML PO SYRP: 5.0000 mL | ORAL_SOLUTION | Freq: Three times a day (TID) | ORAL | Status: DC | PRN

## 2013-11-11 MED ORDER — AZITHROMYCIN 250 MG PO TABS: ORAL_TABLET | ORAL | Status: DC

## 2013-11-11 NOTE — Patient Instructions (Signed)

## 2013-11-11 NOTE — Progress Notes (Signed)
   Subjective:    Patient ID: Nicolas Tyler, male    DOB: 08/30/1945, 68 y.o.   MRN: [id number]  HPI Patient in c/o congestion and cough that started last week. Worse at night. Low grade fever for 2 days- Has trieed alkaseltzer plus.    Review of Systems  Constitutional: Positive for fever and fatigue. Negative for appetite change.  HENT: Positive for congestion.   Respiratory: Positive for cough and shortness of breath (slight when coughing).   Gastrointestinal: Negative.   Genitourinary: Negative.   Neurological: Negative.   Psychiatric/Behavioral: Negative.   All other systems reviewed and are negative.      Objective:   Physical Exam  Constitutional: He is oriented to person, place, and time. He appears well-developed and well-nourished. No distress.  HENT:  Right Ear: Hearing, tympanic membrane, external ear and ear canal normal.  Left Ear: Hearing, tympanic membrane, external ear and ear canal normal.  Nose: Mucosal edema and rhinorrhea present. Right sinus exhibits no maxillary sinus tenderness and no frontal sinus tenderness. Left sinus exhibits no maxillary sinus tenderness and no frontal sinus tenderness.  Mouth/Throat: Uvula is midline, oropharynx is clear and moist and mucous membranes are normal.  Eyes: Pupils are equal, round, and reactive to light.  Neck: Normal range of motion. Neck supple. No thyromegaly present.  Cardiovascular: Normal rate, regular rhythm and normal heart sounds.   Pulmonary/Chest: Effort normal. No respiratory distress.  Rhonchi scattered throughout with deep cough  Neurological: He is alert and oriented to person, place, and time.  Skin: Skin is warm.  Psychiatric: He has a normal mood and affect. His behavior is normal. Judgment and thought content normal.   BP 152/78  Pulse 84  Temp(Src) 99.3 F (37.4 C) (Oral)  Ht 5\' 11"  (1.803 m)  Wt 190 lb (86.183 kg)  BMI 26.51 kg/m2        Assessment & Plan:  1. Acute bronchitis,  unspecified organism 1. Take meds as prescribed 2. Use a cool mist humidifier especially during the winter months and when heat has been humid. 3. Use saline nose sprays frequently 4. Saline irrigations of the nose can be very helpful if done frequently.  * 4X daily for 1 week*  * Use of a nettie pot can be helpful with this. Follow directions with this* 5. Drink plenty of fluids 6. Keep thermostat turn down low 7.For any cough or congestion  Use plain Mucinex- regular strength or max strength is fine- avoid decongestant   * Children- consult with Pharmacist for dosing 8. For fever or aces or pains- take tylenol or ibuprofen appropriate for age and weight.  * for fevers greater than 101 orally you may alternate ibuprofen and tylenol every  3 hours.    - azithromycin (ZITHROMAX Z-PAK) 250 MG tablet; As directed  Dispense: 6 each; Refill: 0 - methylPREDNISolone acetate (DEPO-MEDROL) injection 80 mg; Inject 1 mL (80 mg total) into the muscle once. - HYDROcodone-homatropine (HYCODAN) 5-1.5 MG/5ML syrup; Take 5 mLs by mouth every 8 (eight) hours as needed for cough.  Dispense: 120 mL; Refill: 0  Mary-Margaret Hassell Done, FNP

## 2013-12-25 ENCOUNTER — Ambulatory Visit (INDEPENDENT_AMBULATORY_CARE_PROVIDER_SITE_OTHER): Payer: Medicare Other

## 2013-12-25 DIAGNOSIS — Z23 Encounter for immunization: Secondary | ICD-10-CM

## 2014-01-07 ENCOUNTER — Ambulatory Visit (INDEPENDENT_AMBULATORY_CARE_PROVIDER_SITE_OTHER): Payer: Medicare Other | Admitting: Family Medicine

## 2014-01-07 ENCOUNTER — Encounter: Payer: Self-pay | Admitting: Family Medicine

## 2014-01-07 VITALS — BP 117/71 | HR 52 | Temp 98.1°F | Ht 71.0 in | Wt 184.0 lb

## 2014-01-07 DIAGNOSIS — J209 Acute bronchitis, unspecified: Secondary | ICD-10-CM

## 2014-01-07 MED ORDER — BENZONATATE 100 MG PO CAPS: 100.0000 mg | ORAL_CAPSULE | Freq: Three times a day (TID) | ORAL | Status: DC | PRN

## 2014-01-07 MED ORDER — AZITHROMYCIN 250 MG PO TABS: ORAL_TABLET | ORAL | Status: DC

## 2014-01-07 NOTE — Progress Notes (Signed)
   Subjective:    Patient ID: Nicolas Tyler, male    DOB: 03-13-1945, 68 y.o.   MRN: [id number]  HPI C/o uri sx's and cough  Review of Systems  Constitutional: Negative for fever.  HENT: Negative for ear pain.   Eyes: Negative for discharge.  Respiratory: Negative for cough.   Cardiovascular: Negative for chest pain.  Gastrointestinal: Negative for abdominal distention.  Endocrine: Negative for polyuria.  Genitourinary: Negative for difficulty urinating.  Musculoskeletal: Negative for gait problem and neck pain.  Skin: Negative for color change and rash.  Neurological: Negative for speech difficulty and headaches.  Psychiatric/Behavioral: Negative for agitation.       Objective:    BP 117/71 mmHg  Pulse 52  Temp(Src) 98.1 F (36.7 C) (Oral)  Ht 5\' 11"  (1.803 m)  Wt 184 lb (83.462 kg)  BMI 25.67 kg/m2 Physical Exam  Constitutional: He is oriented to person, place, and time. He appears well-developed and well-nourished.  HENT:  Head: Normocephalic and atraumatic.  Mouth/Throat: Oropharynx is clear and moist.  Eyes: Pupils are equal, round, and reactive to light.  Neck: Normal range of motion. Neck supple.  Cardiovascular: Normal rate and regular rhythm.   No murmur heard. Pulmonary/Chest: Effort normal and breath sounds normal.  Abdominal: Soft. Bowel sounds are normal. There is no tenderness.  Neurological: He is alert and oriented to person, place, and time.  Skin: Skin is warm and dry.  Psychiatric: He has a normal mood and affect.          Assessment & Plan:     ICD-9-CM ICD-10-CM   1. Acute bronchitis, unspecified organism 466.0 J20.9 azithromycin (ZITHROMAX) 250 MG tablet     benzonatate (TESSALON PERLES) 100 MG capsule    Push po fluids, rest, tylenol and motrin otc prn as directed for fever, arthralgias, and myalgias.  Follow up prn if sx's continue or persist. No Follow-up on file.  Lysbeth Penner FNP

## 2014-04-13 ENCOUNTER — Emergency Department (HOSPITAL_COMMUNITY): Admission: EM | Admit: 2014-04-13 | Discharge: 2014-04-13 | Discharge status: Absent without leave | Payer: Self-pay

## 2014-04-13 NOTE — ED Notes (Signed)
Complain of weakness, not eating, unable to stand since the first of February. States he has blood in his urine since yesterday

## 2014-04-21 ENCOUNTER — Other Ambulatory Visit: Payer: Self-pay | Admitting: Nurse Practitioner

## 2014-04-23 ENCOUNTER — Ambulatory Visit (INDEPENDENT_AMBULATORY_CARE_PROVIDER_SITE_OTHER): Payer: Medicare Other | Admitting: Nurse Practitioner

## 2014-04-23 ENCOUNTER — Encounter: Payer: Self-pay | Admitting: Nurse Practitioner

## 2014-04-23 VITALS — BP 127/63 | HR 49 | Temp 96.9°F | Ht 71.0 in | Wt 180.0 lb

## 2014-04-23 DIAGNOSIS — I10 Essential (primary) hypertension: Secondary | ICD-10-CM | POA: Diagnosis not present

## 2014-04-23 DIAGNOSIS — Z23 Encounter for immunization: Secondary | ICD-10-CM

## 2014-04-23 DIAGNOSIS — E785 Hyperlipidemia, unspecified: Secondary | ICD-10-CM | POA: Diagnosis not present

## 2014-04-23 MED ORDER — LISINOPRIL 10 MG PO TABS: 10.0000 mg | ORAL_TABLET | Freq: Every day | ORAL | Status: DC

## 2014-04-23 NOTE — Addendum Note (Signed)
Addended by: Rolena Infante on: 04/23/2014 12:52 PM   Modules accepted: Orders

## 2014-04-23 NOTE — Progress Notes (Signed)
Subjective:    Patient ID: Nicolas Tyler, male    DOB: 1945-03-19, 69 y.o.   MRN: [id number]  Patient in today for annual physical exam- doing well-   Hypertension This is a chronic problem. The current episode started more than 1 year ago. The problem is unchanged. The problem is controlled. Pertinent negatives include no chest pain, palpitations or shortness of breath. There are no associated agents to hypertension. Risk factors for coronary artery disease include dyslipidemia, male gender and post-menopausal state. Past treatments include ACE inhibitors. The current treatment provides moderate improvement. Compliance problems include diet and exercise.   Hyperlipidemia This is a chronic problem. The current episode started more than 1 year ago. Recent lipid tests were reviewed and are variable. Pertinent negatives include no chest pain or shortness of breath. He is currently on no antihyperlipidemic treatment. The current treatment provides moderate improvement of lipids. Compliance problems include adherence to diet and adherence to exercise.  Risk factors for coronary artery disease include dyslipidemia, family history and hypertension.     Review of Systems  Constitutional: Negative.   HENT: Negative.   Respiratory: Negative for shortness of breath.   Cardiovascular: Negative for chest pain and palpitations.  Gastrointestinal: Negative for constipation.  Neurological: Negative.   Psychiatric/Behavioral: Negative.  Negative for sleep disturbance.  All other systems reviewed and are negative.      Objective:   Physical Exam  Constitutional: He is oriented to person, place, and time. He appears well-developed and well-nourished.  HENT:  Head: Normocephalic.  Right Ear: External ear normal.  Left Ear: External ear normal.  Nose: Nose normal.  Mouth/Throat: Oropharynx is clear and moist.  Eyes: Pupils are equal, round, and reactive to light.  Neck: Normal range of motion.  Neck supple. No JVD present. No thyromegaly present.  Cardiovascular: Normal rate, regular rhythm, normal heart sounds and intact distal pulses.  Exam reveals no gallop and no friction rub.   No murmur heard. Pulmonary/Chest: Effort normal and breath sounds normal. No respiratory distress. He has no wheezes. He has no rales. He exhibits no tenderness.  Abdominal: Soft. Bowel sounds are normal. He exhibits no mass. There is no tenderness.  Musculoskeletal: Normal range of motion. He exhibits no edema.  Lymphadenopathy:    He has no cervical adenopathy.  Neurological: He is alert and oriented to person, place, and time. No cranial nerve deficit.  Skin: Skin is warm and dry.  Psychiatric: He has a normal mood and affect. His behavior is normal. Judgment and thought content normal.   BP 127/63 mmHg  Pulse 49  Temp(Src) 96.9 F (36.1 C) (Oral)  Ht 5' 11" (1.803 m)  Wt 180 lb (81.647 kg)  BMI 25.12 kg/m2        Assessment & Plan:   1. Essential hypertension do not add slat to diet - lisinopril (PRINIVIL,ZESTRIL) 10 MG tablet; Take 1 tablet (10 mg total) by mouth daily.  Dispense: 90 tablet; Refill: 1 - CMP14+EGFR  2. Hyperlipidemia with target LDL less than 100 Watch fat in diet - NMR, lipoprofile   TD today Labs pending Health maintenance reviewed Diet and exercise encouraged Continue all meds Follow up  In 3 months   [place], FNP

## 2014-04-23 NOTE — Patient Instructions (Signed)
Exercise to Stay Healthy Exercise helps you become and stay healthy. EXERCISE IDEAS AND TIPS Choose exercises that:  You enjoy.  Fit into your day. You do not need to exercise really hard to be healthy. You can do exercises at a slow or medium level and stay healthy. You can:  Stretch before and after working out.  Try yoga, Pilates, or tai chi.  Lift weights.  Walk fast, swim, jog, run, climb stairs, bicycle, dance, or rollerskate.  Take aerobic classes. Exercises that burn about 150 calories:  Running 1  miles in 15 minutes.  Playing volleyball for 45 to 60 minutes.  Washing and waxing a car for 45 to 60 minutes.  Playing touch football for 45 minutes.  Walking 1  miles in 35 minutes.  Pushing a stroller 1  miles in 30 minutes.  Playing basketball for 30 minutes.  Raking leaves for 30 minutes.  Bicycling 5 miles in 30 minutes.  Walking 2 miles in 30 minutes.  Dancing for 30 minutes.  Shoveling snow for 15 minutes.  Swimming laps for 20 minutes.  Walking up stairs for 15 minutes.  Bicycling 4 miles in 15 minutes.  Gardening for 30 to 45 minutes.  Jumping rope for 15 minutes.  Washing windows or floors for 45 to 60 minutes. Document Released: 02/25/2010 Document Revised: 04/17/2011 Document Reviewed: 02/25/2010 Exit[employer] Patient Information 2015 [employer]. This information is not intended to replace advice given to you by your health care provider. Make sure you discuss any questions you have with your health care provider.  

## 2014-04-24 LAB — CMP14+EGFR
ALK PHOS: 75 IU/L (ref 39–117)
ALT: 13 IU/L (ref 0–44)
AST: 20 IU/L (ref 0–40)
Albumin/Globulin Ratio: 1.6 (ref 1.1–2.5)
Albumin: 4.2 g/dL (ref 3.5–4.8)
BUN / CREAT RATIO: 26 — AB (ref 10–22)
BUN: 23 mg/dL (ref 8–27)
Bilirubin Total: 0.3 mg/dL (ref 0.0–1.2)
CHLORIDE: 102 mmol/L (ref 97–108)
CO2: 23 mmol/L (ref 18–29)
Calcium: 9.5 mg/dL (ref 8.6–10.2)
Creatinine, Ser: 0.88 mg/dL (ref 0.76–1.27)
GFR calc Af Amer: 101 mL/min/{1.73_m2} (ref >59)
GFR calc non Af Amer: 87 mL/min/{1.73_m2} (ref >59)
Globulin, Total: 2.7 g/dL (ref 1.5–4.5)
Glucose: 84 mg/dL (ref 65–99)
Potassium: 4.2 mmol/L (ref 3.5–5.2)
SODIUM: 140 mmol/L (ref 134–144)
Total Protein: 6.9 g/dL (ref 6.0–8.5)

## 2014-04-24 LAB — NMR, LIPOPROFILE
CHOLESTEROL: 184 mg/dL (ref 100–199)
HDL Cholesterol by NMR: 56 mg/dL (ref >39)
HDL Particle Number: 35.8 umol/L (ref >=30.5)
LDL Particle Number: 1108 nmol/L — ABNORMAL HIGH (ref <1000)
LDL SIZE: 21.1 nm (ref >20.5)
LDL-C: 104 mg/dL — ABNORMAL HIGH (ref 0–99)
LP-IR Score: 31 (ref <=45)
SMALL LDL PARTICLE NUMBER: 154 nmol/L (ref <=527)
TRIGLYCERIDES BY NMR: 120 mg/dL (ref 0–149)

## 2014-04-27 ENCOUNTER — Telehealth: Payer: Self-pay | Admitting: Nurse Practitioner

## 2014-08-19 ENCOUNTER — Encounter: Payer: Self-pay | Admitting: Internal Medicine

## 2014-11-12 ENCOUNTER — Other Ambulatory Visit: Payer: Self-pay | Admitting: Nurse Practitioner

## 2014-11-14 ENCOUNTER — Other Ambulatory Visit: Payer: Self-pay | Admitting: Nurse Practitioner

## 2015-01-14 ENCOUNTER — Encounter: Payer: Self-pay | Admitting: Nurse Practitioner

## 2015-01-14 ENCOUNTER — Ambulatory Visit (INDEPENDENT_AMBULATORY_CARE_PROVIDER_SITE_OTHER): Payer: Medicare Other | Admitting: Nurse Practitioner

## 2015-01-14 VITALS — BP 115/62 | HR 46 | Temp 97.0°F | Ht 71.0 in | Wt 197.0 lb

## 2015-01-14 DIAGNOSIS — I10 Essential (primary) hypertension: Secondary | ICD-10-CM | POA: Diagnosis not present

## 2015-01-14 DIAGNOSIS — H02052 Trichiasis without entropian right lower eyelid: Secondary | ICD-10-CM | POA: Diagnosis not present

## 2015-01-14 DIAGNOSIS — E785 Hyperlipidemia, unspecified: Secondary | ICD-10-CM | POA: Diagnosis not present

## 2015-01-14 DIAGNOSIS — Z0000 Encounter for general adult medical examination without abnormal findings: Secondary | ICD-10-CM | POA: Diagnosis not present

## 2015-01-14 DIAGNOSIS — Z23 Encounter for immunization: Secondary | ICD-10-CM | POA: Diagnosis not present

## 2015-01-14 DIAGNOSIS — Z1212 Encounter for screening for malignant neoplasm of rectum: Secondary | ICD-10-CM

## 2015-01-14 DIAGNOSIS — Z125 Encounter for screening for malignant neoplasm of prostate: Secondary | ICD-10-CM

## 2015-01-14 DIAGNOSIS — Z1159 Encounter for screening for other viral diseases: Secondary | ICD-10-CM | POA: Diagnosis not present

## 2015-01-14 DIAGNOSIS — H2513 Age-related nuclear cataract, bilateral: Secondary | ICD-10-CM | POA: Diagnosis not present

## 2015-01-14 DIAGNOSIS — Z6827 Body mass index (BMI) 27.0-27.9, adult: Secondary | ICD-10-CM

## 2015-01-14 DIAGNOSIS — H52223 Regular astigmatism, bilateral: Secondary | ICD-10-CM | POA: Diagnosis not present

## 2015-01-14 DIAGNOSIS — H5203 Hypermetropia, bilateral: Secondary | ICD-10-CM | POA: Diagnosis not present

## 2015-01-14 MED ORDER — LISINOPRIL 10 MG PO TABS: ORAL_TABLET | ORAL | Status: DC

## 2015-01-14 NOTE — Addendum Note (Signed)
Addended by: Rolena Infante on: 01/14/2015 05:04 PM   Modules accepted: Orders

## 2015-01-14 NOTE — Addendum Note (Signed)
Addended by: Earlene Plater on: 01/14/2015 12:05 PM   Modules accepted: Miquel Dunn

## 2015-01-14 NOTE — Progress Notes (Signed)
Subjective:    Patient ID: Nicolas Tyler, male    DOB: 03/24/1945, 69 y.o.   MRN: [id number]  Patient in today for annual physical exam- doing well- No complaints today.   Hypertension This is a chronic problem. The current episode started more than 1 year ago. The problem is unchanged. The problem is controlled. Pertinent negatives include no chest pain, palpitations or shortness of breath. There are no associated agents to hypertension. Risk factors for coronary artery disease include dyslipidemia, male gender and post-menopausal state. Past treatments include ACE inhibitors. The current treatment provides moderate improvement. Compliance problems include diet and exercise.   Hyperlipidemia This is a chronic problem. The current episode started more than 1 year ago. Recent lipid tests were reviewed and are variable. Pertinent negatives include no chest pain or shortness of breath. He is currently on no antihyperlipidemic treatment. The current treatment provides moderate improvement of lipids. Compliance problems include adherence to diet and adherence to exercise.  Risk factors for coronary artery disease include dyslipidemia, family history and hypertension.     Review of Systems  Constitutional: Negative.   HENT: Negative.   Respiratory: Negative for shortness of breath.   Cardiovascular: Negative for chest pain and palpitations.  Gastrointestinal: Negative for constipation.  Neurological: Negative.   Psychiatric/Behavioral: Negative.  Negative for sleep disturbance.  All other systems reviewed and are negative.      Objective:   Physical Exam  Constitutional: He is oriented to person, place, and time. He appears well-developed and well-nourished.  HENT:  Head: Normocephalic.  Right Ear: External ear normal.  Left Ear: External ear normal.  Nose: Nose normal.  Mouth/Throat: Oropharynx is clear and moist.  Eyes: Pupils are equal, round, and reactive to light.  Neck: Normal  range of motion. Neck supple. No JVD present. No thyromegaly present.  Cardiovascular: Normal rate, regular rhythm, normal heart sounds and intact distal pulses.  Exam reveals no gallop and no friction rub.   No murmur heard. Pulmonary/Chest: Effort normal and breath sounds normal. No respiratory distress. He has no wheezes. He has no rales. He exhibits no tenderness.  Abdominal: Soft. Bowel sounds are normal. He exhibits no mass. There is no tenderness.  Genitourinary: Penis normal.  Refuses rectal exam   Musculoskeletal: Normal range of motion. He exhibits no edema.  Lymphadenopathy:    He has no cervical adenopathy.  Neurological: He is alert and oriented to person, place, and time. No cranial nerve deficit.  Skin: Skin is warm and dry.  Psychiatric: He has a normal mood and affect. His behavior is normal. Judgment and thought content normal.   BP 115/62 mmHg  Pulse 46  Temp(Src) 97 F (36.1 C) (Oral)  Ht '5\' 11"'  (1.803 m)  Wt 197 lb (89.359 kg)  BMI 27.49 kg/m2        Assessment & Plan:   1. Essential hypertension Do not add salt to diet - lisinopril (PRINIVIL,ZESTRIL) 10 MG tablet; TAKE 1 TABLET (10 MG TOTAL) BY MOUTH DAILY.  Dispense: 90 tablet; Refill: 1  2. Hyperlipidemia with target LDL less than 100 Low fta diet - Lipid panel  3. BMI 27.0-27.9,adult Discussed diet and exercise for person with BMI >25 Will recheck weight in 3-6 months   4. Annual physical exam - CMP14+EGFR - CBC with Differential/Platelet - VITAMIN D 25 Hydroxy (Vit-D Deficiency, Fractures)  5. Prostate cancer screening - PSA, total and free  6. Screening for malignant neoplasm of the rectum - Fecal occult blood, imunochemical;  Future  7. Need for hepatitis C screening test - Hepatitis C antibody    Labs pending Health maintenance reviewed Diet and exercise encouraged Continue all meds Follow up  In 6 months   [place], FNP

## 2015-01-14 NOTE — Patient Instructions (Signed)

## 2015-01-15 ENCOUNTER — Other Ambulatory Visit: Payer: Medicare Other

## 2015-01-15 ENCOUNTER — Other Ambulatory Visit: Payer: Self-pay | Admitting: Nurse Practitioner

## 2015-01-15 DIAGNOSIS — Z1212 Encounter for screening for malignant neoplasm of rectum: Secondary | ICD-10-CM

## 2015-01-15 DIAGNOSIS — R972 Elevated prostate specific antigen [PSA]: Secondary | ICD-10-CM

## 2015-01-15 LAB — CMP14+EGFR
ALBUMIN: 4.2 g/dL (ref 3.5–4.8)
ALK PHOS: 86 IU/L (ref 39–117)
ALT: 22 IU/L (ref 0–44)
AST: 26 IU/L (ref 0–40)
Albumin/Globulin Ratio: 1.4 (ref 1.1–2.5)
BUN/Creatinine Ratio: 26 — ABNORMAL HIGH (ref 10–22)
BUN: 22 mg/dL (ref 8–27)
Bilirubin Total: 0.5 mg/dL (ref 0.0–1.2)
CO2: 27 mmol/L (ref 18–29)
CREATININE: 0.85 mg/dL (ref 0.76–1.27)
Calcium: 9.5 mg/dL (ref 8.6–10.2)
Chloride: 99 mmol/L (ref 97–106)
GFR, EST AFRICAN AMERICAN: 101 mL/min/{1.73_m2} (ref >59)
GFR, EST NON AFRICAN AMERICAN: 88 mL/min/{1.73_m2} (ref >59)
Globulin, Total: 3 g/dL (ref 1.5–4.5)
Glucose: 61 mg/dL — ABNORMAL LOW (ref 65–99)
Potassium: 4.2 mmol/L (ref 3.5–5.2)
SODIUM: 140 mmol/L (ref 136–144)
TOTAL PROTEIN: 7.2 g/dL (ref 6.0–8.5)

## 2015-01-15 LAB — CBC WITH DIFFERENTIAL/PLATELET
BASOS: 0 %
Basophils Absolute: 0 10*3/uL (ref 0.0–0.2)
EOS (ABSOLUTE): 0.4 10*3/uL (ref 0.0–0.4)
EOS: 5 %
HEMATOCRIT: 41.9 % (ref 37.5–51.0)
Hemoglobin: 14 g/dL (ref 12.6–17.7)
Immature Grans (Abs): 0 10*3/uL (ref 0.0–0.1)
Immature Granulocytes: 0 %
LYMPHS ABS: 2.6 10*3/uL (ref 0.7–3.1)
Lymphs: 33 %
MCH: 29.3 pg (ref 26.6–33.0)
MCHC: 33.4 g/dL (ref 31.5–35.7)
MCV: 88 fL (ref 79–97)
MONOS ABS: 0.7 10*3/uL (ref 0.1–0.9)
Monocytes: 9 %
Neutrophils Absolute: 4 10*3/uL (ref 1.4–7.0)
Neutrophils: 53 %
Platelets: 337 10*3/uL (ref 150–379)
RBC: 4.78 x10E6/uL (ref 4.14–5.80)
RDW: 14.2 % (ref 12.3–15.4)
WBC: 7.8 10*3/uL (ref 3.4–10.8)

## 2015-01-15 LAB — PSA, TOTAL AND FREE
PROSTATE SPECIFIC AG, SERUM: 5.6 ng/mL — AB (ref 0.0–4.0)
PSA, Free Pct: 14.6 %
PSA, Free: 0.82 ng/mL

## 2015-01-15 LAB — VITAMIN D 25 HYDROXY (VIT D DEFICIENCY, FRACTURES): Vit D, 25-Hydroxy: 32.8 ng/mL (ref 30.0–100.0)

## 2015-01-15 LAB — LIPID PANEL
CHOL/HDL RATIO: 3.1 ratio (ref 0.0–5.0)
Cholesterol, Total: 187 mg/dL (ref 100–199)
HDL: 60 mg/dL (ref >39)
LDL Calculated: 107 mg/dL — ABNORMAL HIGH (ref 0–99)
TRIGLYCERIDES: 100 mg/dL (ref 0–149)
VLDL Cholesterol Cal: 20 mg/dL (ref 5–40)

## 2015-01-15 LAB — HEPATITIS C ANTIBODY: Hep C Virus Ab: <0.1 s/co ratio (ref 0.0–0.9)

## 2015-01-15 NOTE — Progress Notes (Signed)
Lab only 

## 2015-01-20 LAB — FECAL OCCULT BLOOD, IMMUNOCHEMICAL: Fecal Occult Bld: NEGATIVE

## 2015-03-03 DIAGNOSIS — Z0000 Encounter for general adult medical examination without abnormal findings: Secondary | ICD-10-CM | POA: Diagnosis not present

## 2015-03-03 DIAGNOSIS — R972 Elevated prostate specific antigen [PSA]: Secondary | ICD-10-CM | POA: Diagnosis not present

## 2015-04-07 HISTORY — PX: PROSTATE BIOPSY: SHX241

## 2015-04-21 DIAGNOSIS — R972 Elevated prostate specific antigen [PSA]: Secondary | ICD-10-CM | POA: Diagnosis not present

## 2015-05-05 DIAGNOSIS — Z0000 Encounter for general adult medical examination without abnormal findings: Secondary | ICD-10-CM | POA: Diagnosis not present

## 2015-05-05 DIAGNOSIS — C61 Malignant neoplasm of prostate: Secondary | ICD-10-CM | POA: Diagnosis not present

## 2015-05-10 DIAGNOSIS — L570 Actinic keratosis: Secondary | ICD-10-CM | POA: Diagnosis not present

## 2015-05-10 DIAGNOSIS — D239 Other benign neoplasm of skin, unspecified: Secondary | ICD-10-CM | POA: Diagnosis not present

## 2015-05-10 DIAGNOSIS — D692 Other nonthrombocytopenic purpura: Secondary | ICD-10-CM | POA: Diagnosis not present

## 2015-05-12 ENCOUNTER — Encounter: Payer: Self-pay | Admitting: Radiation Oncology

## 2015-05-12 NOTE — Progress Notes (Signed)
GU Location of Tumor / Histology: prostatic adenocarcinoma  If Prostate Cancer, Gleason Score is (3 + 4) and PSA is (5.07)  Nicolas Tyler's PSA was 3.9 in 2015 the, 5.6 01/14/2015. Dr. Harmon Pier, PCP, referred patient to Dr. Diona Fanti for further evaluation  Biopsies of prostate (if applicable) revealed:   Past/Anticipated interventions by urology, if any: biopsy and referral to radiation oncology  Past/Anticipated interventions by medical oncology, if any: no  Weight changes, if any: no  Bowel/Bladder complaints, if any: mild nocturia x1,occasional post void dribbling. Denies dysuria or hematuria.   Nausea/Vomiting, if any: no  Pain issues, if any:  no  SAFETY ISSUES:  Prior radiation? no  Pacemaker/ICD? no  Possible current pregnancy? no  Is the patient on methotrexate? no  Current Complaints / other details:  70 year old male. NKDA. Currently married. Retired. Prostate volume: 18.7 cc. IPSS 11

## 2015-05-13 ENCOUNTER — Encounter: Payer: Self-pay | Admitting: Radiation Oncology

## 2015-05-13 ENCOUNTER — Ambulatory Visit
Admission: RE | Admit: 2015-05-13 | Discharge: 2015-05-13 | Disposition: A | Discharge status: Home / Self Care | Payer: Medicare Other | Source: Ambulatory Visit | Attending: Radiation Oncology | Admitting: Radiation Oncology

## 2015-05-13 VITALS — BP 122/66 | HR 48 | Resp 16 | Ht 71.0 in | Wt 193.9 lb

## 2015-05-13 DIAGNOSIS — Z85820 Personal history of malignant melanoma of skin: Secondary | ICD-10-CM | POA: Diagnosis not present

## 2015-05-13 DIAGNOSIS — Z85828 Personal history of other malignant neoplasm of skin: Secondary | ICD-10-CM | POA: Insufficient documentation

## 2015-05-13 DIAGNOSIS — E785 Hyperlipidemia, unspecified: Secondary | ICD-10-CM | POA: Diagnosis not present

## 2015-05-13 DIAGNOSIS — I10 Essential (primary) hypertension: Secondary | ICD-10-CM | POA: Diagnosis not present

## 2015-05-13 DIAGNOSIS — C61 Malignant neoplasm of prostate: Secondary | ICD-10-CM | POA: Diagnosis not present

## 2015-05-13 HISTORY — DX: Malignant neoplasm of prostate: C61

## 2015-05-13 HISTORY — DX: Unspecified malignant neoplasm of skin, unspecified: C44.90

## 2015-05-13 NOTE — Progress Notes (Signed)
See progress note under physician encounter. 

## 2015-05-13 NOTE — Progress Notes (Signed)
Radiation Oncology         [phone] ________________________________  Initial Outpatient Consultation  Name: Nicolas Tyler MRN: [id number]  Date: 05/13/2015  DOB: Sep 29, 1945  [id number] MARGARET, FNP  Nicolas Gallo, MD   REFERRING PHYSICIAN: Franchot Gallo, MD  DIAGNOSIS: 70 y.o. gentleman with stage T1c adenocarcinoma of the prostate with a Gleason's score of 3+4 and 3+3 and a PSA of 5.07    ICD-9-CM ICD-10-CM   1. Malignant neoplasm of prostate ([place]) Nicolas Tyler "Nicolas Tyler" is a 70 y.o. gentleman. He was noted to have an elevated PSA of 3.9 in 2015 which increased to 5.6 by 01/14/2015 by his primary care physician. Accordingly, he was referred for evaluation in urology by Dr. Diona Tyler on 03/03/2015. Digital rectal examination was performed at that time revealing no nodules. The patient proceeded to transrectal ultrasound with 12 biopsies of the prostate on 03/15/201, revealing adenocarcinoma. Out of 12 core biopsies, 2 revealed 3+4 and 1 revealed 3+3.  The patient reviewed the biopsy results with his urologist and he has kindly been referred today for discussion of potential radiation treatment options.   PREVIOUS RADIATION THERAPY: No  PAST MEDICAL HISTORY:  Past Medical History  Diagnosis Date  . Male erectile disorder   . Peyronie disease   . Hyperlipidemia   . Hypertension   . Melanoma ([place])   . Prostate cancer ([place])   . Skin cancer       PAST SURGICAL HISTORY: Past Surgical History  Procedure Laterality Date  . Penis insertion    . Inguinal hernia repair  2012    FAMILY HISTORY: family history includes Coronary artery disease in his mother; Heart disease in his father.  SOCIAL HISTORY:  reports that he quit smoking about 40 years ago. He has never used smokeless tobacco. He reports that he drinks about 2.4 oz of alcohol per week. He reports that he does not use illicit drugs. The patient is married and  resides in the [place] area. He is retired and has one child, and two grand children.  ALLERGIES: Review of patient's allergies indicates no known allergies.  MEDICATIONS:  Current Outpatient Prescriptions  Medication Sig Dispense Refill  . aspirin 81 MG tablet Take 81 mg by mouth daily.      Marland Kitchen lisinopril (PRINIVIL,ZESTRIL) 10 MG tablet TAKE 1 TABLET (10 MG TOTAL) BY MOUTH DAILY. 90 tablet 1  . Multiple Vitamins-Minerals (MULTIVITAMIN WITH MINERALS) tablet Take 1 tablet by mouth daily.     No current facility-administered medications for this encounter.    REVIEW OF SYSTEMS: On review of systems, the patient reports that he is doing well overall. He denies any chest pain, shortness of breath, cough, fevers, chills, night sweats, unintended weight changes. He denies any bowel disturbances, and denies abdominal pain, nausea or vomiting. He denies any new musculoskeletal or joint aches or pains. The patient completed an IPSS and IIEF questionnaire. His IPSS score was 11 indicating moderate urinary outflow obstructive symptoms. He describes urinary frequency and nocturia x1. He indicated that his erectile function is satisfactory. A complete review of systems is obtained and is otherwise negative.    PHYSICAL EXAM:  height is 5\' 11"  (1.803 m) and weight is 193 lb 14.4 oz (87.952 kg). His blood pressure is 122/66 and his pulse is 48. His respiration is 16 and oxygen saturation is 100%.    In general, this is a well appearing male in no acute distress. He  is alert and oriented x4 and appropriate throughout the examination. HEENT reveals that the patient is normocephalic, atraumatic. EOMs are intact. PERRLA. Skin is intact without any evidence of gross lesions. Cardiovascular exam reveals a regular rate and rhythm, no clicks rubs or murmurs are auscultated. Chest is clear to auscultation bilaterally. Lymphatic assessment is performed and does not reveal any adenopathy in the cervical, supraclavicular,  axillary, or inguinal chains. Abdomen has active bowel sounds in all quadrants and is intact. The abdomen is soft, non tender, non distended. Lower extremities are negative for pretibial pitting edema, deep calf tenderness, cyanosis or clubbing.  KPS = 100  100 - Normal; no complaints; no evidence of disease. 90   - Able to carry on normal activity; minor signs or symptoms of disease. 80   - Normal activity with effort; some signs or symptoms of disease. 69   - Cares for self; unable to carry on normal activity or to do active work. 60   - Requires occasional assistance, but is able to care for most of his personal needs. 50   - Requires considerable assistance and frequent medical care. 52   - Disabled; requires special care and assistance. 69   - Severely disabled; hospital admission is indicated although death not imminent. 66   - Very sick; hospital admission necessary; active supportive treatment necessary. 10   - Moribund; fatal processes progressing rapidly. 0     - Dead  Karnofsky DA, Abelmann [place], Craver LS and Burchenal [facility] [phone]) The use of the nitrogen mustards in the palliative treatment of carcinoma: with particular reference to bronchogenic carcinoma Cancer 1 634-56   LABORATORY DATA:  Lab Results  Component Value Date   WBC 7.8 01/14/2015   HGB 13.2* 05/27/2013   HCT 41.9 01/14/2015   MCV 88 01/14/2015   PLT 337 01/14/2015   Lab Results  Component Value Date   NA 140 01/14/2015   K 4.2 01/14/2015   CL 99 01/14/2015   CO2 27 01/14/2015   Lab Results  Component Value Date   ALT 22 01/14/2015   AST 26 01/14/2015   ALKPHOS 86 01/14/2015   BILITOT 0.5 01/14/2015     RADIOGRAPHY: No results found.    IMPRESSION: This gentleman is a 70 y.o with stage T1c adenocarcinoma of the prostate with a Gleason's score of 3+4  And 3+3 and a PSA of 5.07. His T-Stage, Gleason's Score, and PSA put him into the intermediate risk group. Accordingly he is eligible for a variety of  potential treatment options including surgery or external radiation. Prostate seed implant is a possibility; however, this may prove to be challenging due to small prostate size and penile prosthesis.   PLAN: Mr. Sobieraj is unsure of whether he would like to proceed with radiation or surgery. He is currently being scheduled to meet with Dr. Alinda Money or Dr. Tresa Moore to discuss the option of surgery. Dr. Tammi Klippel discussed the possible risks, side effects, and benefits of external radiation and prostate seed implant with the patient. After meeting with surgery, he will make a final decision about which treatment plan to proceed with. I will contact him to discuss his plans once he's been seen.  The above documentation reflects my direct findings during this shared patient visit. Please see the separate note by Dr. Tammi Klippel on this date for the remainder of the patient's plan of care.    Carola Rhine, PAC   This document serves as a record of services personally performed  by Shona Simpson, PA and Tyler Pita, MD. It was created on their behalf by Jenell Milliner, a trained medical scribe. The creation of this record is based on the scribe's personal observations and the provider's statements to them. This document has been checked and approved by the attending provider.

## 2015-05-17 ENCOUNTER — Other Ambulatory Visit: Payer: Self-pay | Admitting: Nurse Practitioner

## 2015-05-20 ENCOUNTER — Telehealth: Payer: Self-pay | Admitting: Radiation Oncology

## 2015-05-20 NOTE — Telephone Encounter (Addendum)
I spoke with the patient, and he will be meeting with Dr. Alinda Money on 06/08/2015 for further discussion of the role of radical prostatectomy. I asked the patient if I could call him back a few weeks after that to discuss if he had any additional thoughts or questions and whether he had made decision about which treatment option he would pursue. He is in agreement with this.

## 2015-05-24 ENCOUNTER — Ambulatory Visit: Payer: Self-pay

## 2015-05-24 ENCOUNTER — Ambulatory Visit: Payer: Self-pay | Admitting: Radiation Oncology

## 2015-06-08 DIAGNOSIS — C61 Malignant neoplasm of prostate: Secondary | ICD-10-CM | POA: Diagnosis not present

## 2015-06-10 ENCOUNTER — Other Ambulatory Visit: Payer: Self-pay | Admitting: Urology

## 2015-06-14 DIAGNOSIS — C61 Malignant neoplasm of prostate: Secondary | ICD-10-CM | POA: Diagnosis not present

## 2015-06-14 DIAGNOSIS — M6281 Muscle weakness (generalized): Secondary | ICD-10-CM | POA: Diagnosis not present

## 2015-06-17 ENCOUNTER — Telehealth: Payer: Self-pay | Admitting: Radiation Oncology

## 2015-06-17 NOTE — Telephone Encounter (Signed)
I spoke with the patient and he has elected to proceed with surgery and has plans to do son on 08/03/15 with Dr. Alinda Money.

## 2015-06-28 DIAGNOSIS — M6281 Muscle weakness (generalized): Secondary | ICD-10-CM | POA: Diagnosis not present

## 2015-06-28 DIAGNOSIS — C61 Malignant neoplasm of prostate: Secondary | ICD-10-CM | POA: Diagnosis not present

## 2015-07-27 ENCOUNTER — Other Ambulatory Visit: Payer: Self-pay | Admitting: Urology

## 2015-07-27 ENCOUNTER — Other Ambulatory Visit (HOSPITAL_COMMUNITY): Payer: Self-pay | Admitting: *Deleted

## 2015-07-27 NOTE — Progress Notes (Signed)
Left message with selita at [facility] urology, get dr borden to correct consent for surgery, booked for prostatectomy, consent says nephrectomy

## 2015-07-27 NOTE — Patient Instructions (Addendum)
Nicolas Tyler  07/27/2015   Your procedure is scheduled on: 08-02-15  Report to [facility]  Entrance take [facility]  elevators to 3rd floor to  [place] at 930  AM.  Call this number if you have problems the morning of surgery [phone]   Remember: ONLY 1 PERSON MAY GO WITH YOU TO SHORT STAY TO GET  READY MORNING OF [place].  Do not eat food after  Midnight Saturday night, clear liquids all day 08-01-15 per dr borden instructions, follow all bowel prep instructions from dr borden     Take these medicines the morning of surgery with A SIP OF WATER: none                                You may not have any metal on your body including hair pins and              piercings  Do not wear jewelry, make-up, lotions, powders or perfumes, deodorant             Do not wear nail polish.  Do not shave  48 hours prior to surgery.              Men may shave face and neck.   Do not bring valuables to the hospital. [place].  Contacts, dentures or bridgework may not be worn into surgery.  Leave suitcase in the car. After surgery it may be brought to your room.                Please read over the following fact sheets you were given: _____________________________________________________________________             [facility] - Preparing for Surgery Before surgery, you can play an important role.  Because skin is not sterile, your skin needs to be as free of germs as possible.  You can reduce the number of germs on your skin by washing with CHG (chlorahexidine gluconate) soap before surgery.  CHG is an antiseptic cleaner which kills germs and bonds with the skin to continue killing germs even after washing. Please DO NOT use if you have an allergy to CHG or antibacterial soaps.  If your skin becomes reddened/irritated stop using the CHG and inform your nurse when you arrive at Short Stay. Do not shave  (including legs and underarms) for at least 48 hours prior to the first CHG shower.  You may shave your face/neck. Please follow these instructions carefully:  1.  Shower with CHG Soap the night before surgery and the  morning of Surgery.  2.  If you choose to wash your hair, wash your hair first as usual with your  normal  shampoo.  3.  After you shampoo, rinse your hair and body thoroughly to remove the  shampoo.                           4.  Use CHG as you would any other liquid soap.  You can apply chg directly  to the skin and wash  Gently with a scrungie or clean washcloth.  5.  Apply the CHG Soap to your body ONLY FROM THE NECK DOWN.   Do not use on face/ open                           Wound or open sores. Avoid contact with eyes, ears mouth and genitals (private parts).                       Wash face,  Genitals (private parts) with your normal soap.             6.  Wash thoroughly, paying special attention to the area where your surgery  will be performed.  7.  Thoroughly rinse your body with warm water from the neck down.  8.  DO NOT shower/wash with your normal soap after using and rinsing off  the CHG Soap.                9.  Pat yourself dry with a clean towel.            10.  Wear clean pajamas.            11.  Place clean sheets on your bed the night of your first shower and do not  sleep with pets. Day of Surgery : Do not apply any lotions/deodorants the morning of surgery.  Please wear clean clothes to the hospital/surgery center.  FAILURE TO FOLLOW THESE INSTRUCTIONS MAY RESULT IN THE CANCELLATION OF YOUR SURGERY PATIENT SIGNATURE_________________________________  NURSE SIGNATURE__________________________________  ________________________________________________________________________    CLEAR LIQUID DIET   Foods Allowed                                                                     Foods Excluded  Coffee and tea, regular and decaf                              liquids that you cannot  Plain Jell-O in any flavor                                             see through such as: Fruit ices (not with fruit pulp)                                     milk, soups, orange juice  Iced Popsicles                                    All solid food Carbonated beverages, regular and diet                                    Cranberry, grape and apple juices Sports drinks like Gatorade Lightly seasoned clear broth or consume(fat free) Sugar, honey syrup  Sample Menu Breakfast                                Lunch                                     Supper Cranberry juice                    Beef broth                            Chicken broth Jell-O                                     Grape juice                           Apple juice Coffee or tea                        Jell-O                                      Popsicle                                                Coffee or tea                        Coffee or tea  _____________________________________________________________________    Incentive Spirometer  An incentive spirometer is a tool that can help keep your lungs clear and active. This tool measures how well you are filling your lungs with each breath. Taking long deep breaths may help reverse or decrease the chance of developing breathing (pulmonary) problems (especially infection) following:  A long period of time when you are unable to move or be active. BEFORE THE PROCEDURE   If the spirometer includes an indicator to show your best effort, your nurse or respiratory therapist will set it to a desired goal.  If possible, sit up straight or lean slightly forward. Try not to slouch.  Hold the incentive spirometer in an upright position. INSTRUCTIONS FOR USE   Sit on the edge of your bed if possible, or sit up as far as you can in bed or on a chair.  Hold the incentive spirometer in an upright position.  Breathe out  normally.  Place the mouthpiece in your mouth and seal your lips tightly around it.  Breathe in slowly and as deeply as possible, raising the piston or the ball toward the top of the column.  Hold your breath for 3-5 seconds or for as long as possible. Allow the piston or ball to fall to the bottom of the column.  Remove the mouthpiece from your mouth and breathe out normally.  Rest for a few seconds and repeat Steps 1 through 7 at least 10 times every 1-2 hours when you are awake. Take your time and take a few normal breaths between  deep breaths.  The spirometer may include an indicator to show your best effort. Use the indicator as a goal to work toward during each repetition.  After each set of 10 deep breaths, practice coughing to be sure your lungs are clear. If you have an incision (the cut made at the time of surgery), support your incision when coughing by placing a pillow or rolled up towels firmly against it. Once you are able to get out of bed, walk around indoors and cough well. You may stop using the incentive spirometer when instructed by your caregiver.  RISKS AND COMPLICATIONS  Take your time so you do not get dizzy or light-headed.  If you are in pain, you may need to take or ask for pain medication before doing incentive spirometry. It is harder to take a deep breath if you are having pain. AFTER USE  Rest and breathe slowly and easily.  It can be helpful to keep track of a log of your progress. Your caregiver can provide you with a simple table to help with this. If you are using the spirometer at home, follow these instructions: [place] IF:   You are having difficultly using the spirometer.  You have trouble using the spirometer as often as instructed.  Your pain medication is not giving enough relief while using the spirometer.  You develop fever of 100.5 F (38.1 C) or higher. SEEK IMMEDIATE MEDICAL CARE IF:   You cough up bloody sputum that had  not been present before.  You develop fever of 102 F (38.9 C) or greater.  You develop worsening pain at or near the incision site. MAKE SURE YOU:   Understand these instructions.  Will watch your condition.  Will get help right away if you are not doing well or get worse. Document Released: 06/05/2006 Document Revised: 04/17/2011 Document Reviewed: 08/06/2006 Exit[employer] Patient Information 2014 Exit[employer], [place].   ________________________________________________________________________  WHAT IS A BLOOD TRANSFUSION? Blood Transfusion Information  A transfusion is the replacement of blood or some of its parts. Blood is made up of multiple cells which provide different functions.  Red blood cells carry oxygen and are used for blood loss replacement.  White blood cells fight against infection.  Platelets control bleeding.  Plasma helps clot blood.  Other blood products are available for specialized needs, such as hemophilia or other clotting disorders. BEFORE THE TRANSFUSION  Who gives blood for transfusions?   Healthy volunteers who are fully evaluated to make sure their blood is safe. This is blood bank blood. Transfusion therapy is the safest it has ever been in the practice of medicine. Before blood is taken from a donor, a complete history is taken to make sure that person has no history of diseases nor engages in risky social behavior (examples are intravenous drug use or sexual activity with multiple partners). The donor's travel history is screened to minimize risk of transmitting infections, such as malaria. The donated blood is tested for signs of infectious diseases, such as HIV and hepatitis. The blood is then tested to be sure it is compatible with you in order to minimize the chance of a transfusion reaction. If you or a relative donates blood, this is often done in anticipation of surgery and is not appropriate for emergency situations. It takes many days to process the  donated blood. RISKS AND COMPLICATIONS Although transfusion therapy is very safe and saves many lives, the main dangers of transfusion include:   Getting an infectious disease.  Developing a transfusion reaction. This is an allergic reaction to something in the blood you were given. Every precaution is taken to prevent this. The decision to have a blood transfusion has been considered carefully by your caregiver before blood is given. Blood is not given unless the benefits outweigh the risks. AFTER THE TRANSFUSION  Right after receiving a blood transfusion, you will usually feel much better and more energetic. This is especially true if your red blood cells have gotten low (anemic). The transfusion raises the level of the red blood cells which carry oxygen, and this usually causes an energy increase.  The nurse administering the transfusion will monitor you carefully for complications. HOME CARE INSTRUCTIONS  No special instructions are needed after a transfusion. You may find your energy is better. Speak with your caregiver about any limitations on activity for underlying diseases you may have. SEEK MEDICAL CARE IF:   Your condition is not improving after your transfusion.  You develop redness or irritation at the intravenous (IV) site. SEEK IMMEDIATE MEDICAL CARE IF:  Any of the following symptoms occur over the next 12 hours:  Shaking chills.  You have a temperature by mouth above 102 F (38.9 C), not controlled by medicine.  Chest, back, or muscle pain.  People around you feel you are not acting correctly or are confused.  Shortness of breath or difficulty breathing.  Dizziness and fainting.  You get a rash or develop hives.  You have a decrease in urine output.  Your urine turns a dark color or changes to pink, red, or brown. Any of the following symptoms occur over the next 10 days:  You have a temperature by mouth above 102 F (38.9 C), not controlled by  medicine.  Shortness of breath.  Weakness after normal activity.  The white part of the eye turns yellow (jaundice).  You have a decrease in the amount of urine or are urinating less often.  Your urine turns a dark color or changes to pink, red, or brown. Document Released: 01/21/2000 Document Revised: 04/17/2011 Document Reviewed: 09/09/2007 [facility] Patient Information 2014 [place], [place].  _______________________________________________________________________

## 2015-07-28 ENCOUNTER — Ambulatory Visit (HOSPITAL_COMMUNITY)
Admission: RE | Admit: 2015-07-28 | Discharge: 2015-07-28 | Disposition: A | Discharge status: Home / Self Care | Payer: Medicare Other | Source: Ambulatory Visit | Attending: Urology | Admitting: Urology

## 2015-07-28 ENCOUNTER — Encounter (HOSPITAL_COMMUNITY)
Admission: RE | Admit: 2015-07-28 | Discharge: 2015-07-28 | Disposition: A | Discharge status: Home / Self Care | Payer: Medicare Other | Source: Ambulatory Visit | Attending: Urology | Admitting: Urology

## 2015-07-28 ENCOUNTER — Encounter (HOSPITAL_COMMUNITY): Payer: Self-pay

## 2015-07-28 DIAGNOSIS — Z01818 Encounter for other preprocedural examination: Secondary | ICD-10-CM

## 2015-07-28 LAB — BASIC METABOLIC PANEL
Anion gap: 6 (ref 5–15)
BUN: 27 mg/dL — AB (ref 6–20)
CALCIUM: 9.3 mg/dL (ref 8.9–10.3)
CHLORIDE: 106 mmol/L (ref 101–111)
CO2: 28 mmol/L (ref 22–32)
CREATININE: 1.06 mg/dL (ref 0.61–1.24)
GFR calc Af Amer: >60 mL/min (ref >60)
GFR calc non Af Amer: >60 mL/min (ref >60)
Glucose, Bld: 104 mg/dL — ABNORMAL HIGH (ref 65–99)
Potassium: 4.5 mmol/L (ref 3.5–5.1)
SODIUM: 140 mmol/L (ref 135–145)

## 2015-07-28 LAB — CBC
HCT: 41.5 % (ref 39.0–52.0)
Hemoglobin: 13.3 g/dL (ref 13.0–17.0)
MCH: 28.9 pg (ref 26.0–34.0)
MCHC: 32 g/dL (ref 30.0–36.0)
MCV: 90.2 fL (ref 78.0–100.0)
PLATELETS: 296 10*3/uL (ref 150–400)
RBC: 4.6 MIL/uL (ref 4.22–5.81)
RDW: 13.5 % (ref 11.5–15.5)
WBC: 6.7 10*3/uL (ref 4.0–10.5)

## 2015-07-28 LAB — ABO/RH: ABO/RH(D): AB POS

## 2015-07-30 NOTE — H&P (Signed)
Chief Complaint Prostate Cancer   History of Present Illness Nicolas Tyler is a 70 year old gentleman who was found to have an elevated PSA of 5.07 prompting a TRUS biopsy of the prostate on 04/21/15. This confirmed Gleason 3+4=7 adenocarcinoma with 3 out of 12 biopsy cores positive for malignancy. He has no family history of prostate cancer. He has undergone a prior inflatable penile prosthesis for treatment of erectile dysfunction. His penile prosthesis was placed proximally 10 years ago and still functions very well. He has been counseled about his options by Dr. Diona Fanti and Dr. Tammi Klippel and has expressed interest in proceeding with surgery. ** He is exceedingly healthy. He has a history of well controlled hypertension but has no other medical comorbidities. TNM stage: cT1c Nx Mx PSA: 5.07 Gleason score: 3+4=7 Biopsy (04/21/15): 3/12 cores -- L lateral apex (80%, 3+4=7), L apex (5%, 3+3=6), L lateral mid (50%, 3+4=7) Prostate volume: 18.7 cc PSAD: 0.27 Nomogram OC disease: 51% EPE: 48% SVI: 3% LNI: 2% PFS (surgery): 89% at 5 years, 82% at 10 years Urinary function: IPSS is 7 Erectile function: He is s/p IPP placement.  Past Medical History Problems  1. History of Erectile dysfunction due to arterial insufficiency (N52.01) 2. History of hypertension (Z86.79) 3. History of Peyronie's disease (N48.6) Surgical History Problems  1. History of Inguinal Hernia Repair 2. History of Surg Penis Insertion Of Penile Prosthesis Current Meds 1. Aspirin 81 MG TABS; Therapy: (Recorded:25Jan2017) to Recorded 2. Lisinopril 10 MG Oral Tablet; Therapy: (Recorded:25Jan2017) to Recorded 3. Multi-Day Vitamins TABS; Therapy: (Recorded:25Jan2017) to Recorded Allergies Medication  1. No Known Drug Allergies Family History Problems  1. Family history of Death In The Family Father  Died age 10/Natural causes 2. Family history of Death of family member : Mother  Mom died at age 58 from heart dz.  Dad died at age 59 from heart dz. 3. Family history of Nephrolithiasis, uric acid : Brother Social History Problems   Being A Social Drinker  Caffeine Use  4+ per day  Former smoker [phone])  Smoked 1 ppd for 18 years and Stopped April of 1976  Marital History - Currently Married  Occupation: Retired Review of Systems Genitourinary, constitutional, skin, eye, otolaryngeal, hematologic/lymphatic, cardiovascular, pulmonary, endocrine, musculoskeletal, gastrointestinal, neurological and psychiatric system(s) were reviewed and pertinent findings if present are noted and are otherwise negative.  Cardiovascular: no chest pain.  Respiratory: no shortness of breath.  Vitals  Weight: 196 lb  BMI Calculated: 26.96 BSA Calculated: 2.1  Physical Exam Constitutional: Well nourished and well developed . No acute distress.  ENT:. The ears and nose are normal in appearance.  Neck: The appearance of the neck is normal and no neck mass is present.  Pulmonary: No respiratory distress, normal respiratory rhythm and effort and clear bilateral breath sounds.  Cardiovascular: Heart rate and rhythm are normal . No peripheral edema.  Abdomen: The abdomen is soft and nontender. No masses are palpated. No CVA tenderness. No hernias are palpable. No hepatosplenomegaly noted.    Assessment Assessed  1. Adenocarcinoma of prostate (C61)  Discussion/Summary 1. Prostate cancer: I had a detailed discussion with Nicolas Tyler and his wife today regarding his prostate cancer diagnosis and options for treatment and management. We discussed options including that of active surveillance although he and his wife are adamant that they do wish to proceed with curative therapy. They are very well-informed due to their prior discussions.  The patient was counseled about the natural history of prostate cancer and  the standard treatment options that are available for prostate cancer. It was explained to  him how his age and life expectancy, clinical stage, Gleason score, and PSA affect his prognosis, the decision to proceed with additional staging studies, as well as how that information influences recommended treatment strategies. We discussed the roles for active surveillance, radiation therapy, surgical therapy, androgen deprivation, as well as ablative therapy options for the treatment of prostate cancer as appropriate to his individual cancer situation. We discussed the risks and benefits of these options with regard to their impact on cancer control and also in terms of potential adverse events, complications, and impact on quiality of life particularly related to urinary, bowel, and sexual function. The patient was encouraged to ask questions throughout the discussion today and all questions were answered to his stated satisfaction. In addition, the patient was provided with and/or directed to appropriate resources and literature for further education about prostate cancer and treatment options.  We discussed surgical therapy for prostate cancer including the different available surgical approaches. We discussed, in detail, the risks and expectations of surgery with regard to cancer control, urinary control, and erectile function as well as the expected postoperative recovery process. Additional risks of surgery including but not limited to bleeding, infection, hernia formation, nerve damage, lymphocele formation, bowel/rectal injury potentially necessitating colostomy, damage to the urinary tract resulting in urine leakage, urethral stricture, and the cardiopulmonary risks such as myocardial infarction, stroke, death, venothromboembolism, etc. were explained. The risk of open surgical conversion for robotic/laparoscopic prostatectomy was also discussed.  After discussion, he would like to proceed with surgical treatment of his prostate cancer and will be scheduled for a robot-assisted laparoscopic radical  prostatectomy and pelvic lymphadenectomy. We specifically discussed the issue related to his penile prosthesis including the potential risk of prosthetic infection or injury. He understands that the overall risks would be low but potentially serious and ultimately could require removal of his prosthesis. In addition, we discussed the fact that depending on the location of his reservoir, this may affect the ability to perform a lymph node dissection on one side or the other. He understands that radiation therapy would offer the ability to avoid these potential risks related to his prosthesis. He would like to be scheduled for the near future.

## 2015-08-01 MED ORDER — GENTAMICIN SULFATE 40 MG/ML IJ SOLN
360.0000 mg | INTRAVENOUS | Status: AC
  Administered 2015-08-02: 360 mg via INTRAVENOUS
  Filled 2015-08-01: qty 9

## 2015-08-02 ENCOUNTER — Inpatient Hospital Stay (HOSPITAL_COMMUNITY): Payer: Medicare Other | Admitting: Anesthesiology

## 2015-08-02 ENCOUNTER — Encounter (HOSPITAL_COMMUNITY): Payer: Self-pay | Admitting: *Deleted

## 2015-08-02 ENCOUNTER — Encounter (HOSPITAL_COMMUNITY)
Admission: RE | Disposition: A | Discharge status: Home / Self Care | Payer: Self-pay | Source: Ambulatory Visit | Attending: Urology

## 2015-08-02 ENCOUNTER — Inpatient Hospital Stay (HOSPITAL_COMMUNITY)
Admission: RE | Admit: 2015-08-02 | Discharge: 2015-08-03 | DRG: 708 | Disposition: A | Discharge status: Home / Self Care | Payer: Medicare Other | Source: Ambulatory Visit | Attending: Urology | Admitting: Urology

## 2015-08-02 DIAGNOSIS — I10 Essential (primary) hypertension: Secondary | ICD-10-CM | POA: Diagnosis not present

## 2015-08-02 DIAGNOSIS — Z7982 Long term (current) use of aspirin: Secondary | ICD-10-CM | POA: Diagnosis not present

## 2015-08-02 DIAGNOSIS — C61 Malignant neoplasm of prostate: Secondary | ICD-10-CM | POA: Diagnosis not present

## 2015-08-02 DIAGNOSIS — Z79899 Other long term (current) drug therapy: Secondary | ICD-10-CM

## 2015-08-02 DIAGNOSIS — Z87891 Personal history of nicotine dependence: Secondary | ICD-10-CM

## 2015-08-02 HISTORY — PX: ROBOT ASSISTED LAPAROSCOPIC RADICAL PROSTATECTOMY: SHX5141

## 2015-08-02 HISTORY — PX: LYMPHADENECTOMY: SHX5960

## 2015-08-02 LAB — TYPE AND SCREEN
ABO/RH(D): AB POS
Antibody Screen: NEGATIVE

## 2015-08-02 LAB — HEMOGLOBIN AND HEMATOCRIT, BLOOD
HEMATOCRIT: 40.8 % (ref 39.0–52.0)
HEMOGLOBIN: 13.6 g/dL (ref 13.0–17.0)

## 2015-08-02 SURGERY — XI ROBOTIC ASSISTED LAPAROSCOPIC RADICAL PROSTATECTOMY LEVEL 3
Anesthesia: General

## 2015-08-02 MED ORDER — KCL IN DEXTROSE-NACL 20-5-0.45 MEQ/L-%-% IV SOLN
INTRAVENOUS | Status: DC
  Administered 2015-08-02 – 2015-08-03 (×3): via INTRAVENOUS
  Filled 2015-08-02 (×4): qty 1000

## 2015-08-02 MED ORDER — HYDROMORPHONE HCL 2 MG/ML IJ SOLN
INTRAMUSCULAR | Status: AC
  Filled 2015-08-02: qty 1

## 2015-08-02 MED ORDER — MIDAZOLAM HCL 5 MG/5ML IJ SOLN
INTRAMUSCULAR | Status: DC | PRN
  Administered 2015-08-02: 2 mg via INTRAVENOUS

## 2015-08-02 MED ORDER — SUGAMMADEX SODIUM 200 MG/2ML IV SOLN
INTRAVENOUS | Status: AC
  Filled 2015-08-02: qty 2

## 2015-08-02 MED ORDER — SODIUM CHLORIDE 0.9 % IV BOLUS (SEPSIS)
1000.0000 mL | Freq: Once | INTRAVENOUS | Status: AC
  Administered 2015-08-02: 1000 mL via INTRAVENOUS

## 2015-08-02 MED ORDER — LACTATED RINGERS IV SOLN
INTRAVENOUS | Status: DC | PRN
  Administered 2015-08-02: 1000 mL

## 2015-08-02 MED ORDER — MORPHINE SULFATE (PF) 2 MG/ML IV SOLN: 2.0000 mg | INTRAVENOUS | Status: DC | PRN

## 2015-08-02 MED ORDER — HEPARIN SODIUM (PORCINE) 1000 UNIT/ML IJ SOLN
INTRAMUSCULAR | Status: AC
  Filled 2015-08-02: qty 1

## 2015-08-02 MED ORDER — DEXAMETHASONE SODIUM PHOSPHATE 10 MG/ML IJ SOLN
INTRAMUSCULAR | Status: DC | PRN
  Administered 2015-08-02: 10 mg via INTRAVENOUS

## 2015-08-02 MED ORDER — FENTANYL CITRATE (PF) 100 MCG/2ML IJ SOLN
INTRAMUSCULAR | Status: DC | PRN
  Administered 2015-08-02 (×2): 100 ug via INTRAVENOUS
  Administered 2015-08-02: 50 ug via INTRAVENOUS

## 2015-08-02 MED ORDER — BUPIVACAINE-EPINEPHRINE 0.25% -1:200000 IJ SOLN
INTRAMUSCULAR | Status: DC | PRN
  Administered 2015-08-02: 30 mL

## 2015-08-02 MED ORDER — SULFAMETHOXAZOLE-TRIMETHOPRIM 800-160 MG PO TABS: 1.0000 | ORAL_TABLET | Freq: Two times a day (BID) | ORAL | Status: DC

## 2015-08-02 MED ORDER — HYDROMORPHONE HCL 1 MG/ML IJ SOLN
INTRAMUSCULAR | Status: DC | PRN
  Administered 2015-08-02 (×2): 1 mg via INTRAVENOUS

## 2015-08-02 MED ORDER — HYDROMORPHONE HCL 1 MG/ML IJ SOLN
0.2500 mg | INTRAMUSCULAR | Status: DC | PRN
  Administered 2015-08-02 (×3): 0.5 mg via INTRAVENOUS

## 2015-08-02 MED ORDER — SUCCINYLCHOLINE CHLORIDE 20 MG/ML IJ SOLN
INTRAMUSCULAR | Status: DC | PRN
  Administered 2015-08-02: 100 mg via INTRAVENOUS

## 2015-08-02 MED ORDER — HYDROMORPHONE HCL 1 MG/ML IJ SOLN
INTRAMUSCULAR | Status: AC
  Filled 2015-08-02: qty 1

## 2015-08-02 MED ORDER — CEFAZOLIN IN D5W 1 GM/50ML IV SOLN
1.0000 g | Freq: Three times a day (TID) | INTRAVENOUS | Status: AC
  Administered 2015-08-02 – 2015-08-03 (×2): 1 g via INTRAVENOUS
  Filled 2015-08-02 (×2): qty 50

## 2015-08-02 MED ORDER — DEXAMETHASONE SODIUM PHOSPHATE 10 MG/ML IJ SOLN
INTRAMUSCULAR | Status: AC
  Filled 2015-08-02: qty 1

## 2015-08-02 MED ORDER — DIPHENHYDRAMINE HCL 12.5 MG/5ML PO ELIX: 12.5000 mg | ORAL_SOLUTION | Freq: Four times a day (QID) | ORAL | Status: DC | PRN

## 2015-08-02 MED ORDER — DIPHENHYDRAMINE HCL 50 MG/ML IJ SOLN: 12.5000 mg | Freq: Four times a day (QID) | INTRAMUSCULAR | Status: DC | PRN

## 2015-08-02 MED ORDER — LACTATED RINGERS IV SOLN
INTRAVENOUS | Status: DC
  Administered 2015-08-02 (×2): via INTRAVENOUS

## 2015-08-02 MED ORDER — HYDROCODONE-ACETAMINOPHEN 5-325 MG PO TABS
1.0000 | ORAL_TABLET | Freq: Four times a day (QID) | ORAL | Status: DC | PRN
Start: 2015-08-02 — End: 2016-02-01

## 2015-08-02 MED ORDER — FENTANYL CITRATE (PF) 250 MCG/5ML IJ SOLN
INTRAMUSCULAR | Status: AC
  Filled 2015-08-02: qty 5

## 2015-08-02 MED ORDER — KETOROLAC TROMETHAMINE 15 MG/ML IJ SOLN
15.0000 mg | Freq: Four times a day (QID) | INTRAMUSCULAR | Status: DC
  Administered 2015-08-02 – 2015-08-03 (×3): 15 mg via INTRAVENOUS
  Filled 2015-08-02 (×3): qty 1

## 2015-08-02 MED ORDER — ACETAMINOPHEN 325 MG PO TABS: 650.0000 mg | ORAL_TABLET | ORAL | Status: DC | PRN

## 2015-08-02 MED ORDER — BUPIVACAINE-EPINEPHRINE (PF) 0.25% -1:200000 IJ SOLN
INTRAMUSCULAR | Status: AC
  Filled 2015-08-02: qty 30

## 2015-08-02 MED ORDER — LIDOCAINE HCL (CARDIAC) 20 MG/ML IV SOLN
INTRAVENOUS | Status: AC
  Filled 2015-08-02: qty 5

## 2015-08-02 MED ORDER — SUGAMMADEX SODIUM 200 MG/2ML IV SOLN
INTRAVENOUS | Status: DC | PRN
  Administered 2015-08-02: 200 mg via INTRAVENOUS

## 2015-08-02 MED ORDER — PROPOFOL 10 MG/ML IV BOLUS
INTRAVENOUS | Status: AC
  Filled 2015-08-02: qty 20

## 2015-08-02 MED ORDER — LIDOCAINE HCL (CARDIAC) 20 MG/ML IV SOLN
INTRAVENOUS | Status: DC | PRN
  Administered 2015-08-02: 100 mg via INTRAVENOUS

## 2015-08-02 MED ORDER — KETOROLAC TROMETHAMINE 30 MG/ML IJ SOLN
INTRAMUSCULAR | Status: AC
  Filled 2015-08-02: qty 1

## 2015-08-02 MED ORDER — PROPOFOL 10 MG/ML IV BOLUS
INTRAVENOUS | Status: DC | PRN
  Administered 2015-08-02: 150 mg via INTRAVENOUS

## 2015-08-02 MED ORDER — ROCURONIUM BROMIDE 100 MG/10ML IV SOLN
INTRAVENOUS | Status: AC
  Filled 2015-08-02: qty 1

## 2015-08-02 MED ORDER — DOCUSATE SODIUM 100 MG PO CAPS
100.0000 mg | ORAL_CAPSULE | Freq: Two times a day (BID) | ORAL | Status: DC
  Administered 2015-08-02 – 2015-08-03 (×2): 100 mg via ORAL
  Filled 2015-08-02 (×2): qty 1

## 2015-08-02 MED ORDER — ROCURONIUM BROMIDE 100 MG/10ML IV SOLN
INTRAVENOUS | Status: DC | PRN
  Administered 2015-08-02: 10 mg via INTRAVENOUS
  Administered 2015-08-02: 40 mg via INTRAVENOUS

## 2015-08-02 MED ORDER — VANCOMYCIN HCL IN DEXTROSE 1-5 GM/200ML-% IV SOLN
1000.0000 mg | INTRAVENOUS | Status: AC
  Administered 2015-08-02: 1000 mg via INTRAVENOUS
  Filled 2015-08-02: qty 200

## 2015-08-02 MED ORDER — ONDANSETRON HCL 4 MG/2ML IJ SOLN
INTRAMUSCULAR | Status: AC
  Filled 2015-08-02: qty 2

## 2015-08-02 MED ORDER — KETOROLAC TROMETHAMINE 30 MG/ML IJ SOLN
15.0000 mg | Freq: Once | INTRAMUSCULAR | Status: AC | PRN
  Administered 2015-08-02: 15 mg via INTRAVENOUS

## 2015-08-02 MED ORDER — PROMETHAZINE HCL 25 MG/ML IJ SOLN
6.2500 mg | INTRAMUSCULAR | Status: DC | PRN
Start: 2015-08-02 — End: 2015-08-02

## 2015-08-02 MED ORDER — SODIUM CHLORIDE 0.9 % IR SOLN
Status: DC | PRN
  Administered 2015-08-02: 1000 mL via INTRAVESICAL

## 2015-08-02 MED ORDER — MIDAZOLAM HCL 2 MG/2ML IJ SOLN
INTRAMUSCULAR | Status: AC
  Filled 2015-08-02: qty 2

## 2015-08-02 SURGICAL SUPPLY — 50 items
APPLICATOR COTTON TIP 6IN STRL (MISCELLANEOUS) ×3 IMPLANT
CATH FOLEY 2WAY SLVR 18FR 30CC (CATHETERS) ×3 IMPLANT
CATH ROBINSON RED A/P 16FR (CATHETERS) ×3 IMPLANT
CATH ROBINSON RED A/P 8FR (CATHETERS) ×3 IMPLANT
CATH TIEMANN FOLEY 18FR 5CC (CATHETERS) ×3 IMPLANT
CHLORAPREP W/TINT 26ML (MISCELLANEOUS) ×3 IMPLANT
CLIP LIGATING HEM O LOK PURPLE (MISCELLANEOUS) ×6 IMPLANT
COVER SURGICAL LIGHT HANDLE (MISCELLANEOUS) ×3 IMPLANT
COVER TIP SHEARS 8 DVNC (MISCELLANEOUS) ×2 IMPLANT
COVER TIP SHEARS 8MM DA VINCI (MISCELLANEOUS) ×1
CUTTER ECHEON FLEX ENDO 45 340 (ENDOMECHANICALS) ×3 IMPLANT
DECANTER SPIKE VIAL GLASS SM (MISCELLANEOUS) IMPLANT
DRAPE ARM DVNC X/XI (DISPOSABLE) ×8 IMPLANT
DRAPE COLUMN DVNC XI (DISPOSABLE) ×2 IMPLANT
DRAPE DA VINCI XI ARM (DISPOSABLE) ×4
DRAPE DA VINCI XI COLUMN (DISPOSABLE) ×1
DRAPE SURG IRRIG POUCH 19X23 (DRAPES) ×3 IMPLANT
DRSG TEGADERM 4X4.75 (GAUZE/BANDAGES/DRESSINGS) ×3 IMPLANT
ELECT REM PT RETURN 9FT ADLT (ELECTROSURGICAL) ×3
ELECTRODE REM PT RTRN 9FT ADLT (ELECTROSURGICAL) ×2 IMPLANT
GLOVE BIO SURGEON STRL SZ 6.5 (GLOVE) ×3 IMPLANT
GLOVE BIOGEL M STRL SZ7.5 (GLOVE) ×6 IMPLANT
GOWN STRL REUS W/TWL LRG LVL3 (GOWN DISPOSABLE) ×9 IMPLANT
HOLDER FOLEY CATH W/STRAP (MISCELLANEOUS) ×3 IMPLANT
IV LACTATED RINGERS 1000ML (IV SOLUTION) IMPLANT
LIQUID BAND (GAUZE/BANDAGES/DRESSINGS) IMPLANT
NDL SAFETY ECLIPSE 18X1.5 (NEEDLE) ×2 IMPLANT
NEEDLE HYPO 18GX1.5 SHARP (NEEDLE) ×1
PACK ROBOT UROLOGY CUSTOM (CUSTOM PROCEDURE TRAY) ×3 IMPLANT
RELOAD GREEN ECHELON 45 (STAPLE) ×3 IMPLANT
SEAL CANN UNIV 5-8 DVNC XI (MISCELLANEOUS) ×8 IMPLANT
SEAL XI 5MM-8MM UNIVERSAL (MISCELLANEOUS) ×4
SET TUBE IRRIG SUCTION NO TIP (IRRIGATION / IRRIGATOR) ×3 IMPLANT
SOLUTION ELECTROLUBE (MISCELLANEOUS) ×3 IMPLANT
SUT ETHILON 3 0 PS 1 (SUTURE) ×3 IMPLANT
SUT MNCRL 3 0 RB1 (SUTURE) ×2 IMPLANT
SUT MNCRL 3 0 VIOLET RB1 (SUTURE) ×2 IMPLANT
SUT MNCRL AB 4-0 PS2 18 (SUTURE) ×6 IMPLANT
SUT MONOCRYL 3 0 RB1 (SUTURE) ×2
SUT VIC AB 0 CT1 27 (SUTURE) ×1
SUT VIC AB 0 CT1 27XBRD ANTBC (SUTURE) ×2 IMPLANT
SUT VIC AB 0 UR5 27 (SUTURE) ×3 IMPLANT
SUT VIC AB 2-0 SH 27 (SUTURE) ×1
SUT VIC AB 2-0 SH 27X BRD (SUTURE) ×2 IMPLANT
SUT VICRYL 0 UR6 27IN ABS (SUTURE) ×6 IMPLANT
SYR 27GX1/2 1ML LL SAFETY (SYRINGE) ×3 IMPLANT
TOWEL OR 17X26 10 PK STRL BLUE (TOWEL DISPOSABLE) ×3 IMPLANT
TOWEL OR NON WOVEN STRL DISP B (DISPOSABLE) ×3 IMPLANT
TUBING INSUFFLATION 10FT LAP (TUBING) ×3 IMPLANT
WATER STERILE IRR 1500ML POUR (IV SOLUTION) IMPLANT

## 2015-08-02 NOTE — Anesthesia Preprocedure Evaluation (Signed)
Anesthesia Evaluation  Patient identified by MRN, date of birth, ID band Patient awake    Reviewed: Allergy & Precautions, NPO status , Patient's Chart, lab work & pertinent test results  Airway Mallampati: II  TM Distance: >3 FB Neck ROM: Full    Dental no notable dental hx.    Pulmonary neg pulmonary ROS, former smoker,    Pulmonary exam normal breath sounds clear to auscultation       Cardiovascular hypertension, Pt. on medications Normal cardiovascular exam Rhythm:Regular Rate:Normal     Neuro/Psych negative neurological ROS  negative psych ROS   GI/Hepatic negative GI ROS, Neg liver ROS,   Endo/Other  negative endocrine ROS  Renal/GU negative Renal ROS  negative genitourinary   Musculoskeletal negative musculoskeletal ROS (+)   Abdominal   Peds negative pediatric ROS (+)  Hematology negative hematology ROS (+)   Anesthesia Other Findings   Reproductive/Obstetrics negative OB ROS                             Anesthesia Physical Anesthesia Plan  ASA: II  Anesthesia Plan: General   Post-op Pain Management:    Induction: Intravenous  Airway Management Planned: Oral ETT  Additional Equipment:   Intra-op Plan:   Post-operative Plan: Extubation in OR  Informed Consent: I have reviewed the patients History and Physical, chart, labs and discussed the procedure including the risks, benefits and alternatives for the proposed anesthesia with the patient or authorized representative who has indicated his/her understanding and acceptance.   Dental advisory given  Plan Discussed with: CRNA and Surgeon  Anesthesia Plan Comments:         Anesthesia Quick Evaluation

## 2015-08-02 NOTE — Discharge Instructions (Signed)

## 2015-08-02 NOTE — Care Management Note (Signed)
Case Management Note  Patient Details  Name: Nicolas Tyler MRN: [id number] Date of Birth: 12-29-1945  Subjective/Objective: 70 y/o m admitted w/Prostate Ca. S/p lap rad prostatectomy. From home.                   Action/Plan:d/c plan home.   Expected Discharge Date:                  Expected Discharge Plan:  Home/Self Care  In-House Referral:     Discharge planning Services  CM Consult  Post Acute Care Choice:    Choice offered to:     DME Arranged:    DME Agency:     HH Arranged:    HH Agency:     Status of Service:  In process, will continue to follow  If discussed at Long Length of Stay Meetings, dates discussed:    Additional Comments:  Dessa Phi, RN 08/02/2015, 2:33 PM

## 2015-08-02 NOTE — Anesthesia Postprocedure Evaluation (Signed)
Anesthesia Post Note  Patient: Nicolas Tyler  Procedure(s) Performed: Procedure(s) (LRB): XI ROBOTIC ASSISTED LAPAROSCOPIC RADICAL PROSTATECTOMY LEVEL 3 (N/A) BILATERAL PELVIC LYMPHADENECTOMY (Bilateral)  Patient location during evaluation: PACU Anesthesia Type: General Level of consciousness: awake and alert Pain management: pain level controlled Vital Signs Assessment: post-procedure vital signs reviewed and stable Respiratory status: spontaneous breathing, nonlabored ventilation, respiratory function stable and patient connected to nasal cannula oxygen Cardiovascular status: blood pressure returned to baseline and stable Postop Assessment: no signs of nausea or vomiting Anesthetic complications: no    Last Vitals:  Filed Vitals:   08/02/15 0910 08/02/15 1303  BP: 107/68 137/75  Pulse: 49 61  Temp: 36.6 C 36.4 C  Resp: 16 14    Last Pain: There were no vitals filed for this visit.               Liticia Gasior S

## 2015-08-02 NOTE — Progress Notes (Signed)
Post-op note  Subjective: The patient is doing well.  No complaints.  Denies N/V  Objective: Vital signs in last 24 hours: Temp:  [97.5 F (36.4 C)-97.8 F (36.6 C)] 97.5 F (36.4 C) (06/26 1402) Pulse Rate:  [49-82] 82 (06/26 1402) Resp:  [12-22] 16 (06/26 1402) BP: (107-157)/(68-103) 154/73 mmHg (06/26 1402) SpO2:  [96 %-100 %] 97 % (06/26 1347) Weight:  [83.28 kg (183 lb 9.6 oz)] 83.28 kg (183 lb 9.6 oz) (06/26 0941)  Intake/Output from previous day:   Intake/Output this shift: Total I/O In: 1700 [I.V.:1700] Out: 100 [Blood:100]  Physical Exam:  General: Alert and oriented. Abdomen: Soft, Nondistended. Incisions: Clean and dry. Urine: dark red  Lab Results:  Recent Labs  08/02/15 1313  HGB 13.6  HCT 40.8    Assessment/Plan: POD#0   1) Continue to monitor  2) DVT prophy, clears, IS, amb, pain control   LOS: 0 days   Anastasha Ortez 08/02/2015, 3:45 PM

## 2015-08-02 NOTE — Anesthesia Procedure Notes (Signed)
Procedure Name: Intubation Date/Time: 08/02/2015 10:30 AM Performed by: Lind Covert Pre-anesthesia Checklist: Patient identified, Emergency Drugs available, Suction available, Timeout performed and Patient being monitored Patient Re-evaluated:Patient Re-evaluated prior to inductionOxygen Delivery Method: Circle system utilized Preoxygenation: Pre-oxygenation with 100% oxygen Laryngoscope Size: Mac and 4 Grade View: Grade I Tube type: Oral Tube size: 7.5 mm Number of attempts: 1 Airway Equipment and Method: Stylet Placement Confirmation: ETT inserted through vocal cords under direct vision,  positive ETCO2 and breath sounds checked- equal and bilateral Secured at: 22 cm Tube secured with: Tape Dental Injury: Teeth and Oropharynx as per pre-operative assessment

## 2015-08-02 NOTE — Op Note (Signed)
Preoperative diagnosis: Clinically localized adenocarcinoma of the prostate (clinical stage cT1c Nx Mx)  Postoperative diagnosis: Clinically localized adenocarcinoma of the prostate (clinical stage cT1c Nx Mx)  Procedure:  1. Robotic assisted laparoscopic radical prostatectomy (non nerve sparing) 2. Bilateral robotic assisted laparoscopic pelvic lymphadenectomy  Surgeon: Pryor Curia. M.D.  Assistant(s): Debbrah Alar, PA-C  Anesthesia: General  Complications: None  EBL: 100 mL  IVF:  1700 mL crystalloid  Specimens: 1. Prostate and seminal vesicles 2. Right pelvic lymph nodes 3. Left pelvic lymph nodes  Disposition of specimens: Pathology  Drains: 1. 20 Fr coude catheter 2. # 19 Blake pelvic drain  Indication: Nicolas Tyler is a 70 y.o. year old patient with clinically localized prostate cancer. He is s/p placement of an inflatable penile prosthesis for erectile dysfunction. After a thorough review of the management options for treatment of prostate cancer, he elected to proceed with surgical therapy and the above procedure(s).  We have discussed the potential benefits and risks of the procedure, side effects of the proposed treatment, the likelihood of the patient achieving the goals of the procedure, and any potential problems that might occur during the procedure or recuperation. Informed consent has been obtained.  Description of procedure:  The patient was taken to the operating room and a general anesthetic was administered. He was given preoperative antibiotics, placed in the dorsal lithotomy position, and prepped and draped in the usual sterile fashion. Next a preoperative timeout was performed. A urethral catheter was placed into the bladder and a site was selected near the umbilicus for placement of the camera port. This was placed using a standard open Hassan technique which allowed entry into the peritoneal cavity under direct vision and without difficulty. A  8 mm port was placed and a pneumoperitoneum established. The camera was then used to inspect the abdomen and there was no evidence of any intra-abdominal injuries or other abnormalities except for the expected IPP reservoir noted in the space of Retzius on the left side of the decompressed bladder. The remaining abdominal ports were then placed. 8 mm robotic ports were placed in the right lower quadrant, left lower quadrant, and far left lateral abdominal wall. A 5 mm port was placed in the right upper quadrant and a 12 mm port was placed in the right lateral abdominal wall for laparoscopic assistance. All ports were placed under direct vision without difficulty. The surgical cart was then docked.   Utilizing the cautery scissors, the bladder was reflected posteriorly allowing entry into the space of Retzius and identification of the endopelvic fascia and prostate. The reservoir filled the left hemipelvis and was carefully dissected free from the bladder with sharp dissection with care to minimize any handling or direct retraction of the reservoir. Once fairly well mobilized, the IPP was cycled allowing the reservoir to deflate the reservoir and improve exposure of the pelvis. This maneuver resulted in adequate space to proceed with the prostatectomy. The periprostatic fat was then removed from the prostate allowing full exposure of the endopelvic fascia. The endopelvic fascia was then incised from the apex back to the base of the prostate bilaterally and the underlying levator muscle fibers were swept laterally off the prostate thereby isolating the dorsal venous complex. The dorsal vein was then stapled and divided with a 45 mm Flex Echelon stapler. Attention then turned to the bladder neck which was divided anteriorly thereby allowing entry into the bladder and exposure of the urethral catheter. The catheter balloon was deflated and  the catheter was brought into the operative field and used to retract the  prostate anteriorly. The posterior bladder neck was then examined and was divided allowing further dissection between the bladder and prostate posteriorly until the vasa deferentia and seminal vessels were identified. The vasa deferentia were isolated, divided, and lifted anteriorly. The seminal vesicles were dissected down to their tips with care to control the seminal vascular arterial blood supply. These structures were then lifted anteriorly and the space between Denonvillier's fascia and the anterior rectum was developed with a combination of sharp and blunt dissection. This isolated the vascular pedicles of the prostate.  A wide non nerve sparing dissection was performed with Weck clips used to ligate the vascular pedicles of the prostate bilaterally. The vascular pedicles of the prostate were then divided.  The urethra was then sharply transected allowing the prostate specimen to be disarticulated. The pelvis was copiously irrigated and hemostasis was ensured. There was no evidence for rectal injury.  Attention then turned to the right pelvic sidewall. The fibrofatty tissue between the external iliac vein, confluence of the iliac vessels, hypogastric artery, and Cooper's ligament was dissected free from the pelvic sidewall with care to preserve the obturator nerve. Weck clips were used for lymphostasis and hemostasis. It was decided not to perform a left sided pelvic lymph node dissection due to the location of the IPP reservoir that was positioned right over the left sided pelvic lymph node packet. The lymphatic packet was removed for permanent pathologic analysis.  Attention then turned to the urethral anastomosis. A 2-0 Vicryl slip knot was placed between Denonvillier's fascia, the posterior bladder neck, and the posterior urethra to reapproximate these structures. A double-armed 3-0 Monocryl suture was then used to perform a 360 running tension-free anastomosis between the bladder neck and  urethra. A new urethral catheter was then placed into the bladder and irrigated. There were no blood clots within the bladder and the anastomosis appeared to be watertight. It was decided not to place a pelvic drain considering the fact that the anastomosis appeared to be excellent and to avoid any additional risk of bacterial contamination of the prosthesis. The surgical cart was then undocked. The right lateral 12 mm port site was closed at the fascial level with a 0 Vicryl suture placed laparoscopically. All remaining ports were then removed under direct vision. The prostate specimen was removed intact within the Endopouch retrieval bag via the periumbilical camera port site. This fascial opening was closed with two running 0 Vicryl sutures. 0.25% Marcaine was then injected into all port sites and all incisions were reapproximated at the skin level with 3-0 Monocryl subcuticular sutures. Dermabond was applied to the skin. The patient appeared to tolerate the procedure well and without complications. The patient was able to be extubated and transferred to the recovery unit in satisfactory condition.  Pryor Curia MD

## 2015-08-02 NOTE — Transfer of Care (Signed)
Immediate Anesthesia Transfer of Care Note  Patient: Nicolas Tyler  Procedure(s) Performed: Procedure(s): XI ROBOTIC ASSISTED LAPAROSCOPIC RADICAL PROSTATECTOMY LEVEL 3 (N/A) BILATERAL PELVIC LYMPHADENECTOMY (Bilateral)  Patient Location: PACU  Anesthesia Type:General  Level of Consciousness: sedated  Airway & Oxygen Therapy: Patient Spontanous Breathing and Patient connected to face mask oxygen  Post-op Assessment: Report given to RN and Post -op Vital signs reviewed and stable  Post vital signs: Reviewed and stable  Last Vitals:  Filed Vitals:   08/02/15 0910  BP: 107/68  Pulse: 49  Temp: 36.6 C  Resp: 16    Last Pain: There were no vitals filed for this visit.    Patients Stated Pain Goal: 3 ([id number])  Complications: No apparent anesthesia complications

## 2015-08-03 LAB — HEMOGLOBIN AND HEMATOCRIT, BLOOD
HCT: 36.2 % — ABNORMAL LOW (ref 39.0–52.0)
Hemoglobin: 12.2 g/dL — ABNORMAL LOW (ref 13.0–17.0)

## 2015-08-03 MED ORDER — HYDROCODONE-ACETAMINOPHEN 5-325 MG PO TABS: 1.0000 | ORAL_TABLET | Freq: Four times a day (QID) | ORAL | Status: DC | PRN

## 2015-08-03 MED ORDER — BISACODYL 10 MG RE SUPP
10.0000 mg | Freq: Once | RECTAL | Status: AC
  Administered 2015-08-03: 10 mg via RECTAL
  Filled 2015-08-03: qty 1

## 2015-08-03 NOTE — Progress Notes (Signed)
Patient ID: Nicolas Tyler, male   DOB: 1945-04-10, 70 y.o.   MRN: [id number]  1 Day Post-Op Subjective: The patient is doing well.  No nausea or vomiting. Pain is adequately controlled.  Objective: Vital signs in last 24 hours: Temp:  [97.5 F (36.4 C)-98.3 F (36.8 C)] 98.1 F (36.7 C) (06/27 0603) Pulse Rate:  [49-82] 81 (06/27 0603) Resp:  [12-22] 18 (06/27 0603) BP: (100-157)/(46-103) 115/46 mmHg (06/27 0603) SpO2:  [96 %-100 %] 98 % (06/27 0603) Weight:  [83.28 kg (183 lb 9.6 oz)-85.73 kg (189 lb)] 85.73 kg (189 lb) (06/26 1402)  Intake/Output from previous day: 06/26 0701 - 06/27 0700 In: 3472.5 [P.O.:480; I.V.:2942.5; IV Piggyback:50] Out: 1550 [Urine:1450; Blood:100] Intake/Output this shift:    Physical Exam:  General: Alert and oriented. CV: RRR Lungs: Clear bilaterally. GI: Soft, Nondistended. Incisions: Clean, dry, and intact Urine: Clear Extremities: Nontender, no erythema, no edema.  Lab Results:  Recent Labs  08/02/15 1313 08/03/15 0526  HGB 13.6 12.2*  HCT 40.8 36.2*      Assessment/Plan: POD# 1 s/p robotic prostatectomy.  1) SL IVF 2) Ambulate, Incentive spirometry 3) Transition to oral pain medication 4) Dulcolax suppository 5) Plan for likely discharge later today   Pryor Curia. MD   LOS: 1 day   Bina Veenstra,LES 08/03/2015, 7:21 AM

## 2015-08-03 NOTE — Discharge Summary (Signed)
  Date of admission: 08/02/2015  Date of discharge: 08/03/2015  Admission diagnosis: Prostate Cancer  Discharge diagnosis: Prostate Cancer  History and Physical: For full details, please see admission history and physical. Briefly, Nicolas Tyler is a 70 y.o. gentleman with localized prostate cancer.  After discussing management/treatment options, he elected to proceed with surgical treatment.  Hospital Course: AITAN ROSSBACH was taken to the operating room on 08/02/2015 and underwent a robotic assisted laparoscopic radical prostatectomy. He tolerated this procedure well and without complications. Postoperatively, he was able to be transferred to a regular hospital room following recovery from anesthesia.  He was able to begin ambulating the night of surgery. He remained hemodynamically stable overnight.  He had excellent urine output with appropriately minimal output from his pelvic drain and his pelvic drain was removed on POD #1.  He was transitioned to oral pain medication, tolerated a clear liquid diet, and had met all discharge criteria and was able to be discharged home later on POD#1.  Laboratory values:  Recent Labs  08/02/15 1313 08/03/15 0526  HGB 13.6 12.2*  HCT 40.8 36.2*    Disposition: Home  Discharge instruction: He was instructed to be ambulatory but to refrain from heavy lifting, strenuous activity, or driving. He was instructed on urethral catheter care.  Discharge medications:     Medication List    STOP taking these medications        aspirin 81 MG tablet     multivitamin with minerals tablet      TAKE these medications        HYDROcodone-acetaminophen 5-325 MG tablet  Commonly known as:  NORCO  Take 1-2 tablets by mouth every 6 (six) hours as needed for moderate pain.     lisinopril 10 MG tablet  Commonly known as:  PRINIVIL,ZESTRIL  TAKE 1 TABLET (10 MG TOTAL) BY MOUTH DAILY.     sulfamethoxazole-trimethoprim 800-160 MG tablet  Commonly known as:   BACTRIM DS,SEPTRA DS  Take 1 tablet by mouth 2 (two) times daily. Start the day prior to foley removal appointment        Followup: He will followup in 1 week for catheter removal and to discuss his surgical pathology results.

## 2015-08-18 DIAGNOSIS — C61 Malignant neoplasm of prostate: Secondary | ICD-10-CM | POA: Diagnosis not present

## 2015-08-24 ENCOUNTER — Telehealth: Payer: Self-pay | Admitting: *Deleted

## 2015-08-24 NOTE — Telephone Encounter (Signed)
Patient called stating that he had a stomach virus and states he would like to come in to have IV fluids.  Informed patient to increase oral intake and if he can not handle any fluids to contact our office.

## 2015-08-30 DIAGNOSIS — R278 Other lack of coordination: Secondary | ICD-10-CM | POA: Diagnosis not present

## 2015-08-30 DIAGNOSIS — N393 Stress incontinence (female) (male): Secondary | ICD-10-CM | POA: Diagnosis not present

## 2015-08-30 DIAGNOSIS — M6281 Muscle weakness (generalized): Secondary | ICD-10-CM | POA: Diagnosis not present

## 2015-09-13 DIAGNOSIS — R278 Other lack of coordination: Secondary | ICD-10-CM | POA: Diagnosis not present

## 2015-09-13 DIAGNOSIS — M6281 Muscle weakness (generalized): Secondary | ICD-10-CM | POA: Diagnosis not present

## 2015-10-18 DIAGNOSIS — M6281 Muscle weakness (generalized): Secondary | ICD-10-CM | POA: Diagnosis not present

## 2015-10-18 DIAGNOSIS — R278 Other lack of coordination: Secondary | ICD-10-CM | POA: Diagnosis not present

## 2015-11-08 DIAGNOSIS — C61 Malignant neoplasm of prostate: Secondary | ICD-10-CM | POA: Diagnosis not present

## 2015-11-08 DIAGNOSIS — M6281 Muscle weakness (generalized): Secondary | ICD-10-CM | POA: Diagnosis not present

## 2015-11-08 DIAGNOSIS — R278 Other lack of coordination: Secondary | ICD-10-CM | POA: Diagnosis not present

## 2015-11-09 DIAGNOSIS — D692 Other nonthrombocytopenic purpura: Secondary | ICD-10-CM | POA: Diagnosis not present

## 2015-11-09 DIAGNOSIS — L570 Actinic keratosis: Secondary | ICD-10-CM | POA: Diagnosis not present

## 2015-11-09 DIAGNOSIS — D239 Other benign neoplasm of skin, unspecified: Secondary | ICD-10-CM | POA: Diagnosis not present

## 2015-11-17 DIAGNOSIS — C61 Malignant neoplasm of prostate: Secondary | ICD-10-CM | POA: Diagnosis not present

## 2015-11-18 ENCOUNTER — Other Ambulatory Visit: Payer: Self-pay | Admitting: Nurse Practitioner

## 2015-11-18 NOTE — Telephone Encounter (Signed)
Sent in refill, needs to be seen for further refills

## 2015-11-29 ENCOUNTER — Other Ambulatory Visit: Payer: Self-pay | Admitting: Nurse Practitioner

## 2015-11-29 NOTE — Telephone Encounter (Signed)
Last refill without being seen 

## 2015-11-30 NOTE — Telephone Encounter (Signed)
LMOVM NTBS before next refill 

## 2015-12-06 DIAGNOSIS — M6281 Muscle weakness (generalized): Secondary | ICD-10-CM | POA: Diagnosis not present

## 2015-12-06 DIAGNOSIS — R278 Other lack of coordination: Secondary | ICD-10-CM | POA: Diagnosis not present

## 2015-12-23 DIAGNOSIS — Z23 Encounter for immunization: Secondary | ICD-10-CM | POA: Diagnosis not present

## 2016-01-06 DIAGNOSIS — M6281 Muscle weakness (generalized): Secondary | ICD-10-CM | POA: Diagnosis not present

## 2016-01-06 DIAGNOSIS — R278 Other lack of coordination: Secondary | ICD-10-CM | POA: Diagnosis not present

## 2016-02-01 ENCOUNTER — Ambulatory Visit (INDEPENDENT_AMBULATORY_CARE_PROVIDER_SITE_OTHER): Payer: Medicare Other | Admitting: Nurse Practitioner

## 2016-02-01 ENCOUNTER — Encounter: Payer: Self-pay | Admitting: Nurse Practitioner

## 2016-02-01 VITALS — BP 125/66 | HR 62 | Temp 97.3°F | Ht 71.0 in | Wt 191.0 lb

## 2016-02-01 DIAGNOSIS — Z1211 Encounter for screening for malignant neoplasm of colon: Secondary | ICD-10-CM

## 2016-02-01 DIAGNOSIS — Z0000 Encounter for general adult medical examination without abnormal findings: Secondary | ICD-10-CM

## 2016-02-01 DIAGNOSIS — Z6826 Body mass index (BMI) 26.0-26.9, adult: Secondary | ICD-10-CM

## 2016-02-01 DIAGNOSIS — Z1212 Encounter for screening for malignant neoplasm of rectum: Secondary | ICD-10-CM

## 2016-02-01 DIAGNOSIS — E785 Hyperlipidemia, unspecified: Secondary | ICD-10-CM

## 2016-02-01 DIAGNOSIS — I10 Essential (primary) hypertension: Secondary | ICD-10-CM | POA: Diagnosis not present

## 2016-02-01 MED ORDER — LISINOPRIL 10 MG PO TABS: ORAL_TABLET | ORAL | 3 refills | Status: DC

## 2016-02-01 NOTE — Patient Instructions (Signed)

## 2016-02-01 NOTE — Progress Notes (Signed)
Subjective:    Patient ID: Nicolas Tyler, male    DOB: 11-07-1945, 70 y.o.   MRN: [id number]  Patient in today for annual physical exam- doing well- No complaints today.   Hypertension  This is a chronic problem. The current episode started more than 1 year ago. The problem is unchanged. The problem is controlled. Pertinent negatives include no chest pain, palpitations or shortness of breath. There are no associated agents to hypertension. Risk factors for coronary artery disease include dyslipidemia, male gender and post-menopausal state. Past treatments include ACE inhibitors. The current treatment provides moderate improvement. Compliance problems include diet and exercise.   Hyperlipidemia  This is a chronic problem. The current episode started more than 1 year ago. Recent lipid tests were reviewed and are variable. Pertinent negatives include no chest pain or shortness of breath. He is currently on no antihyperlipidemic treatment. The current treatment provides moderate improvement of lipids. Compliance problems include adherence to diet and adherence to exercise.  Risk factors for coronary artery disease include dyslipidemia, family history and hypertension.     Review of Systems  Constitutional: Negative.   HENT: Negative.   Respiratory: Negative for shortness of breath.   Cardiovascular: Negative for chest pain and palpitations.  Gastrointestinal: Negative for constipation.  Neurological: Negative.   Psychiatric/Behavioral: Negative.  Negative for sleep disturbance.  All other systems reviewed and are negative.      Objective:   Physical Exam  Constitutional: He is oriented to person, place, and time. He appears well-developed and well-nourished.  HENT:  Head: Normocephalic.  Right Ear: External ear normal.  Left Ear: External ear normal.  Nose: Nose normal.  Mouth/Throat: Oropharynx is clear and moist.  Eyes: Pupils are equal, round, and reactive to light.  Neck:  Normal range of motion. Neck supple. No JVD present. No thyromegaly present.  Cardiovascular: Normal rate, regular rhythm, normal heart sounds and intact distal pulses.  Exam reveals no gallop and no friction rub.   No murmur heard. Pulmonary/Chest: Effort normal and breath sounds normal. No respiratory distress. He has no wheezes. He has no rales. He exhibits no tenderness.  Abdominal: Soft. Bowel sounds are normal. He exhibits no mass. There is no tenderness.  Genitourinary: Penis normal.  Genitourinary Comments: Refuses rectal exam   Musculoskeletal: Normal range of motion. He exhibits no edema.  Lymphadenopathy:    He has no cervical adenopathy.  Neurological: He is alert and oriented to person, place, and time. No cranial nerve deficit.  Skin: Skin is warm and dry.  Psychiatric: He has a normal mood and affect. His behavior is normal. Judgment and thought content normal.   BP 125/66   Pulse 62   Temp 97.3 F (36.3 C) (Oral)   Ht _0  (1.803 m)   Wt 191 lb (86.6 kg)   BMI 26.64 kg/m       Assessment & Plan:  1. Annual physical exam - CBC with Differential/Platelet  2. Essential hypertension Low sodium diet - lisinopril (PRINIVIL,ZESTRIL) 10 MG tablet; TAKE 1 TABLET (10 MG TOTAL) BY MOUTH DAILY.  Dispense: 90 tablet; Refill: 3 - CMP14+EGFR  3. Hyperlipidemia with target LDL less than 100 Low fat diet - Lipid panel  4. BMI 26.0-26.9,adult Discussed diet and exercise for person with BMI >25 Will recheck weight in 3-6 months  5. Encounter for colorectal cancer screening - Fecal occult blood, imunochemical; Future    Labs pending Health maintenance reviewed Diet and exercise encouraged Continue all meds Follow up  In 6 months   [place], FNP

## 2016-02-02 LAB — CMP14+EGFR
ALBUMIN: 4 g/dL (ref 3.5–4.8)
ALK PHOS: 88 IU/L (ref 39–117)
ALT: 16 IU/L (ref 0–44)
AST: 22 IU/L (ref 0–40)
Albumin/Globulin Ratio: 1.3 (ref 1.2–2.2)
BUN / CREAT RATIO: 25 — AB (ref 10–24)
BUN: 22 mg/dL (ref 8–27)
Bilirubin Total: 0.2 mg/dL (ref 0.0–1.2)
CO2: 25 mmol/L (ref 18–29)
CREATININE: 0.88 mg/dL (ref 0.76–1.27)
Calcium: 9.3 mg/dL (ref 8.6–10.2)
Chloride: 104 mmol/L (ref 96–106)
GFR calc non Af Amer: 86 mL/min/{1.73_m2} (ref >59)
GFR, EST AFRICAN AMERICAN: 99 mL/min/{1.73_m2} (ref >59)
GLUCOSE: 90 mg/dL (ref 65–99)
Globulin, Total: 3.1 g/dL (ref 1.5–4.5)
Potassium: 4.2 mmol/L (ref 3.5–5.2)
Sodium: 143 mmol/L (ref 134–144)
TOTAL PROTEIN: 7.1 g/dL (ref 6.0–8.5)

## 2016-02-02 LAB — CBC WITH DIFFERENTIAL/PLATELET
Basophils Absolute: 0 10*3/uL (ref 0.0–0.2)
Basos: 0 %
EOS (ABSOLUTE): 0.6 10*3/uL — ABNORMAL HIGH (ref 0.0–0.4)
EOS: 8 %
HEMATOCRIT: 39.3 % (ref 37.5–51.0)
HEMOGLOBIN: 13.2 g/dL (ref 13.0–17.7)
IMMATURE GRANS (ABS): 0 10*3/uL (ref 0.0–0.1)
Immature Granulocytes: 0 %
LYMPHS ABS: 2.6 10*3/uL (ref 0.7–3.1)
Lymphs: 33 %
MCH: 29.1 pg (ref 26.6–33.0)
MCHC: 33.6 g/dL (ref 31.5–35.7)
MCV: 87 fL (ref 79–97)
MONOCYTES: 8 %
Monocytes Absolute: 0.7 10*3/uL (ref 0.1–0.9)
Neutrophils Absolute: 3.8 10*3/uL (ref 1.4–7.0)
Neutrophils: 51 %
Platelets: 286 10*3/uL (ref 150–379)
RBC: 4.54 x10E6/uL (ref 4.14–5.80)
RDW: 13.8 % (ref 12.3–15.4)
WBC: 7.7 10*3/uL (ref 3.4–10.8)

## 2016-02-02 LAB — LIPID PANEL
Chol/HDL Ratio: 3.6 ratio units (ref 0.0–5.0)
Cholesterol, Total: 187 mg/dL (ref 100–199)
HDL: 52 mg/dL (ref >39)
LDL Calculated: 98 mg/dL (ref 0–99)
Triglycerides: 185 mg/dL — ABNORMAL HIGH (ref 0–149)
VLDL Cholesterol Cal: 37 mg/dL (ref 5–40)

## 2016-02-03 ENCOUNTER — Other Ambulatory Visit: Payer: Medicare Other

## 2016-02-03 DIAGNOSIS — Z1211 Encounter for screening for malignant neoplasm of colon: Secondary | ICD-10-CM | POA: Diagnosis not present

## 2016-02-03 DIAGNOSIS — Z1212 Encounter for screening for malignant neoplasm of rectum: Principal | ICD-10-CM

## 2016-02-04 LAB — FECAL OCCULT BLOOD, IMMUNOCHEMICAL: FECAL OCCULT BLD: NEGATIVE

## 2016-02-08 ENCOUNTER — Encounter: Payer: Self-pay | Admitting: *Deleted

## 2016-02-11 DIAGNOSIS — M62838 Other muscle spasm: Secondary | ICD-10-CM | POA: Diagnosis not present

## 2016-02-11 DIAGNOSIS — M6281 Muscle weakness (generalized): Secondary | ICD-10-CM | POA: Diagnosis not present

## 2016-03-17 DIAGNOSIS — M62838 Other muscle spasm: Secondary | ICD-10-CM | POA: Diagnosis not present

## 2016-03-17 DIAGNOSIS — M6281 Muscle weakness (generalized): Secondary | ICD-10-CM | POA: Diagnosis not present

## 2016-04-21 DIAGNOSIS — M6281 Muscle weakness (generalized): Secondary | ICD-10-CM | POA: Diagnosis not present

## 2016-04-21 DIAGNOSIS — M62838 Other muscle spasm: Secondary | ICD-10-CM | POA: Diagnosis not present

## 2016-05-19 DIAGNOSIS — C61 Malignant neoplasm of prostate: Secondary | ICD-10-CM | POA: Diagnosis not present

## 2016-05-26 DIAGNOSIS — C61 Malignant neoplasm of prostate: Secondary | ICD-10-CM | POA: Diagnosis not present

## 2016-07-12 ENCOUNTER — Telehealth: Payer: Self-pay | Admitting: Nurse Practitioner

## 2016-07-13 NOTE — Telephone Encounter (Signed)
Scheduled

## 2016-07-28 ENCOUNTER — Ambulatory Visit: Payer: Medicare Other

## 2016-08-01 ENCOUNTER — Encounter: Payer: Self-pay | Admitting: Nurse Practitioner

## 2016-08-07 ENCOUNTER — Encounter: Payer: Self-pay | Admitting: *Deleted

## 2016-08-07 ENCOUNTER — Ambulatory Visit (INDEPENDENT_AMBULATORY_CARE_PROVIDER_SITE_OTHER): Payer: Medicare Other | Admitting: *Deleted

## 2016-08-07 VITALS — BP 130/70 | HR 45 | Ht 70.75 in | Wt 189.0 lb

## 2016-08-07 DIAGNOSIS — Z0000 Encounter for general adult medical examination without abnormal findings: Secondary | ICD-10-CM

## 2016-08-07 NOTE — Patient Instructions (Addendum)
  Mr. Nicolas Tyler , Thank you for taking time to come for your Medicare Wellness Visit. I appreciate your ongoing commitment to your health goals. Please review the following plan we discussed and let me know if I can assist you in the future.   These are the goals we discussed: Goals    . Decrease soda or juice intake    . Increase water intake       This is a list of the screening recommended for you and due dates:  Health Maintenance  Topic Date Due  . Flu Shot  09/06/2016  . Colon Cancer Screening  11/30/2016  . Stool Blood Test  02/02/2017  . Tetanus Vaccine  04/22/2024  .  Hepatitis C: One time screening is recommended by Center for Disease Control  (CDC) for  adults born from [birth year] through 1967.   Completed  . Pneumonia vaccines  Completed

## 2016-08-07 NOTE — Progress Notes (Signed)
Subjective:   Nicolas Tyler is a 71 y.o. male who presents for an Initial Medicare Annual Wellness Visit. 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 He lives at home with his wife in [place] on the family farm. They have one adult daughter that lives in [place] and 2 grandsons. He enjoys travelling and plans to travel out [place] and to [place] before long.   Review of Systems  Reports that health is about the same as last year.  Cardiac Risk Factors include: advanced age (>67men, >71 women);hypertension;male gender  Other systems negative.   Objective:    Today's Vitals   08/07/16 1600  BP: 130/70  Pulse: (!) 45  Weight: 189 lb (85.7 kg)  Height: 5' 10.75" (1.797 m)   Body mass index is 26.55 kg/m.  Pulse rate is normally low and patient is asymptomatic.   Current Medications (verified) Outpatient Encounter Prescriptions as of 08/07/2016  Medication Sig  . aspirin 81 MG chewable tablet Chew by mouth daily.  Marland Kitchen lisinopril (PRINIVIL,ZESTRIL) 10 MG tablet TAKE 1 TABLET (10 MG TOTAL) BY MOUTH DAILY.  . Multiple Vitamin (MULTIVITAMIN) tablet Take 1 tablet by mouth daily.   No facility-administered encounter medications on file as of 08/07/2016.     Allergies (verified) Patient has no known allergies.   History: Past Medical History:  Diagnosis Date  . Hyperlipidemia   . Hypertension   . Male erectile disorder   . Melanoma ([place])    PT DENIES  . Peyronie disease   . Prostate cancer ([place])   . Skin cancer    PT DENIES   Past Surgical History:  Procedure Laterality Date  . INGUINAL HERNIA REPAIR  2012  . LYMPHADENECTOMY Bilateral 08/02/2015   Procedure: BILATERAL PELVIC LYMPHADENECTOMY;  Surgeon: Raynelle Bring, MD;  Location: [facility];  Service: Urology;  Laterality: Bilateral;  . PENILE IMPLANT SURGERY  2008 OR 2009  . PROSTATE BIOPSY  MARCH 2017  . ROBOT ASSISTED LAPAROSCOPIC RADICAL PROSTATECTOMY N/A 08/02/2015     Procedure: XI ROBOTIC ASSISTED LAPAROSCOPIC RADICAL PROSTATECTOMY LEVEL 3;  Surgeon: Raynelle Bring, MD;  Location: [facility];  Service: Urology;  Laterality: N/A;   Family History  Problem Relation Age of Onset  . Heart disease Father   . Coronary artery disease Mother   . Irregular heart beat Brother   . Headache Brother   . Asthma Daughter    Social History   Occupational History  . Not on file.   Social History Main Topics  . Smoking status: Former Smoker    Types: Cigarettes    Quit date: 05/28/1974  . Smokeless tobacco: Never Used  . Alcohol use No  . Drug use: No  . Sexual activity: Yes   Tobacco Counseling No tobacco use  Activities of Daily Living In your present state of health, do you have any difficulty performing the following activities: 08/07/2016  Hearing? Y  Vision? N  Difficulty concentrating or making decisions? N  Walking or climbing stairs? N  Dressing or bathing? N  Doing errands, shopping? N  Preparing Food and eating ? N  Using the Toilet? N  In the past six months, have you accidently leaked urine? N  Do you have problems with loss of bowel control? N  Managing your Medications? N  Managing your Finances? N  Housekeeping or managing your Housekeeping? N  Some recent data might be hidden    Immunizations and Health Maintenance  Immunization History  Administered Date(s) Administered  . Influenza, High Dose Seasonal PF 12/06/2014, 01/06/2016  . Influenza,inj,Quad PF,36+ Mos 12/19/2012, 12/25/2013  . Pneumococcal Conjugate-13 05/27/2013  . Pneumococcal Polysaccharide-23 01/14/2015  . Tdap 04/23/2014  . Zoster 12/22/2015   There are no preventive care reminders to display for this patient.  Patient Care Team: Chevis Pretty, FNP as PCP - General (Nurse Practitioner) Franchot Gallo, MD as Consulting Physician (Urology) Raynelle Bring, MD as Consulting Physician (Urology) Melina Schools, OD (Optometry) Irene Shipper, MD as  Consulting Physician (Gastroenterology)  Prostate surgery 07/2015. No other hospitalizations or ER visits.     Assessment:   This is a routine wellness examination for Nicolas Tyler.   Hearing/Vision screen No hearing or vision deficits noted during visit. Patient does wear hearing aids and is due for an eye exam.  Dietary issues and exercise activities discussed: Current Exercise Habits: The patient has a physically strenous job, but has no regular exercise apart from work., Exercise limited by: None identified  Diet: Eats 2 to 3 meals a day. Typically has cereal for breakfast, a light lunch like a pack of nabs and a soda, and dinner is typically a homecooked meal. He does snack after dinner. Drinks about 5 bottles of water a day and has 1 to 2 canned sodas. Concerned that increase in soda intake may be the cause of the weight he has gained.  Goals    . Decrease soda or juice intake    . Increase water intake      Depression Screen PHQ 2/9 Scores 08/07/2016 02/01/2016 05/13/2015 01/14/2015  PHQ - 2 Score 0 0 0 0    Fall Risk Fall Risk  08/07/2016 02/01/2016 05/13/2015 01/14/2015 04/23/2014  Falls in the past year? No No No No Yes  Number falls in past yr: - - - - 1  Injury with Fall? - - - - No    Cognitive Function: MMSE - Mini Mental State Exam 08/07/2016  Orientation to time 5  Orientation to Place 5  Registration 3  Attention/ Calculation 5  Recall 3  Language- name 2 objects 2  Language- repeat 1  Language- follow 3 step command 2  Language- read & follow direction 1  Write a sentence 1  Copy design 1  Total score 29  normal exam      Screening Tests Health Maintenance  Topic Date Due  . INFLUENZA VACCINE  09/06/2016  . COLONOSCOPY  11/30/2016  . COLON CANCER SCREENING ANNUAL FOBT  02/02/2017  . TETANUS/TDAP  04/22/2024  . Hepatitis C Screening  Completed  . PNA vac Low Risk Adult  Completed        Plan:    Colonoscopy due 11/2016 Consider shingles vaccine CPE  scheduled with Chevis Pretty, [place] for 02/07/17. Schedule eye exam Recommended Silver Sneakers Program at the [facility] Increase water and electrolyte intake when working outside this summer Bring a copy of Regulatory affairs officer for EMR  I have personally reviewed and noted the following in the patient's chart:   . Medical and social history . Use of alcohol, tobacco or illicit drugs  . Current medications and supplements . Functional ability and status . Nutritional status . Physical activity . Advanced directives . List of other physicians . Hospitalizations, surgeries, and ER visits in previous 12 months . Vitals . Screenings to include cognitive, depression, and falls . Referrals and appointments  In addition, I have reviewed and discussed with patient certain preventive protocols, quality metrics, and  best practice recommendations. A written personalized care plan for preventive services as well as general preventive health recommendations were provided to patient.     Chong Sicilian, RN  08/07/2016    I have reviewed and agree with the above AWV documentation.   Mary-Margaret Hassell Done, FNP

## 2016-10-16 ENCOUNTER — Other Ambulatory Visit: Payer: Self-pay | Admitting: Dermatology

## 2016-10-16 DIAGNOSIS — D229 Melanocytic nevi, unspecified: Secondary | ICD-10-CM | POA: Diagnosis not present

## 2016-10-16 DIAGNOSIS — D0439 Carcinoma in situ of skin of other parts of face: Secondary | ICD-10-CM | POA: Diagnosis not present

## 2016-10-16 DIAGNOSIS — L570 Actinic keratosis: Secondary | ICD-10-CM | POA: Diagnosis not present

## 2016-10-16 DIAGNOSIS — C44329 Squamous cell carcinoma of skin of other parts of face: Secondary | ICD-10-CM | POA: Diagnosis not present

## 2016-10-16 DIAGNOSIS — D692 Other nonthrombocytopenic purpura: Secondary | ICD-10-CM | POA: Diagnosis not present

## 2016-11-30 DIAGNOSIS — D0439 Carcinoma in situ of skin of other parts of face: Secondary | ICD-10-CM | POA: Diagnosis not present

## 2016-11-30 DIAGNOSIS — C44329 Squamous cell carcinoma of skin of other parts of face: Secondary | ICD-10-CM | POA: Diagnosis not present

## 2016-12-05 ENCOUNTER — Encounter: Payer: Self-pay | Admitting: Internal Medicine

## 2016-12-12 DIAGNOSIS — C61 Malignant neoplasm of prostate: Secondary | ICD-10-CM | POA: Diagnosis not present

## 2016-12-19 ENCOUNTER — Encounter: Payer: Self-pay | Admitting: Internal Medicine

## 2016-12-19 DIAGNOSIS — C61 Malignant neoplasm of prostate: Secondary | ICD-10-CM | POA: Diagnosis not present

## 2017-01-23 DIAGNOSIS — M79674 Pain in right toe(s): Secondary | ICD-10-CM | POA: Diagnosis not present

## 2017-01-23 DIAGNOSIS — I10 Essential (primary) hypertension: Secondary | ICD-10-CM | POA: Diagnosis not present

## 2017-01-23 DIAGNOSIS — Z7982 Long term (current) use of aspirin: Secondary | ICD-10-CM | POA: Diagnosis not present

## 2017-01-23 DIAGNOSIS — Y92009 Unspecified place in unspecified non-institutional (private) residence as the place of occurrence of the external cause: Secondary | ICD-10-CM | POA: Diagnosis not present

## 2017-01-23 DIAGNOSIS — S97112A Crushing injury of left great toe, initial encounter: Secondary | ICD-10-CM | POA: Diagnosis not present

## 2017-01-23 DIAGNOSIS — W228XXA Striking against or struck by other objects, initial encounter: Secondary | ICD-10-CM | POA: Diagnosis not present

## 2017-01-23 DIAGNOSIS — M7989 Other specified soft tissue disorders: Secondary | ICD-10-CM | POA: Diagnosis not present

## 2017-01-23 DIAGNOSIS — Y9389 Activity, other specified: Secondary | ICD-10-CM | POA: Diagnosis not present

## 2017-01-23 DIAGNOSIS — S92412A Displaced fracture of proximal phalanx of left great toe, initial encounter for closed fracture: Secondary | ICD-10-CM | POA: Diagnosis not present

## 2017-01-23 DIAGNOSIS — M79675 Pain in left toe(s): Secondary | ICD-10-CM | POA: Diagnosis not present

## 2017-01-24 DIAGNOSIS — S97112A Crushing injury of left great toe, initial encounter: Secondary | ICD-10-CM | POA: Diagnosis not present

## 2017-01-24 DIAGNOSIS — Y9389 Activity, other specified: Secondary | ICD-10-CM | POA: Diagnosis not present

## 2017-02-12 ENCOUNTER — Ambulatory Visit (AMBULATORY_SURGERY_CENTER): Payer: Self-pay | Admitting: *Deleted

## 2017-02-12 ENCOUNTER — Other Ambulatory Visit: Payer: Self-pay

## 2017-02-12 VITALS — Ht 71.0 in | Wt 190.0 lb

## 2017-02-12 DIAGNOSIS — Z86010 Personal history of colonic polyps: Secondary | ICD-10-CM

## 2017-02-12 MED ORDER — NA SULFATE-K SULFATE-MG SULF 17.5-3.13-1.6 GM/177ML PO SOLN
1.0000 | Freq: Once | ORAL | 0 refills | Status: AC
Start: 2017-02-12 — End: 2017-02-12

## 2017-02-12 NOTE — Progress Notes (Signed)
No egg or soy allergy known to patient  No issues with past sedation with any surgeries  or procedures, no intubation problems  No diet pills per patient No home 02 use per patient  No blood thinners per patient  Pt denies issues with constipation  No A fib or A flutter  EMMI video sent to pt's e mail - pt declined  Informed pt and wife to expect a co pay for the suprep- call with any issues

## 2017-02-15 ENCOUNTER — Ambulatory Visit (INDEPENDENT_AMBULATORY_CARE_PROVIDER_SITE_OTHER): Payer: Medicare Other

## 2017-02-15 ENCOUNTER — Ambulatory Visit (INDEPENDENT_AMBULATORY_CARE_PROVIDER_SITE_OTHER): Payer: Medicare Other | Admitting: Nurse Practitioner

## 2017-02-15 ENCOUNTER — Encounter: Payer: Self-pay | Admitting: Nurse Practitioner

## 2017-02-15 VITALS — BP 119/71 | HR 48 | Temp 96.8°F | Ht 71.0 in | Wt 190.0 lb

## 2017-02-15 DIAGNOSIS — E785 Hyperlipidemia, unspecified: Secondary | ICD-10-CM | POA: Diagnosis not present

## 2017-02-15 DIAGNOSIS — I10 Essential (primary) hypertension: Secondary | ICD-10-CM

## 2017-02-15 DIAGNOSIS — Z6826 Body mass index (BMI) 26.0-26.9, adult: Secondary | ICD-10-CM | POA: Diagnosis not present

## 2017-02-15 DIAGNOSIS — C61 Malignant neoplasm of prostate: Secondary | ICD-10-CM | POA: Diagnosis not present

## 2017-02-15 DIAGNOSIS — Z0000 Encounter for general adult medical examination without abnormal findings: Secondary | ICD-10-CM | POA: Diagnosis not present

## 2017-02-15 MED ORDER — LISINOPRIL 10 MG PO TABS: ORAL_TABLET | ORAL | 3 refills | Status: DC

## 2017-02-15 NOTE — Progress Notes (Addendum)
Subjective:    Patient ID: Nicolas Tyler, male    DOB: 1945-10-12, 72 y.o.   MRN: [id number]  HPI  Nicolas Tyler is here today for follow up of chronic medical problem.  Outpatient Encounter Medications as of 02/15/2017  Medication Sig  . aspirin 81 MG chewable tablet Chew by mouth daily.  Marland Kitchen lisinopril (PRINIVIL,ZESTRIL) 10 MG tablet TAKE 1 TABLET (10 MG TOTAL) BY MOUTH DAILY.  . Multiple Vitamin (MULTIVITAMIN) tablet Take 1 tablet by mouth daily.     1. Essential hypertension  No c/o chest pain, sob or headache. Does not check blood pressure at home. BP Readings from Last 3 Encounters:  08/07/16 130/70  02/01/16 125/66  08/03/15 (!) 115/46     2. Hyperlipidemia with target LDL less than 100  Tries to watch diet- very little exercise  3. BMI 26.0-26.9,adult  No recent weight changes  4. Malignant neoplasm of prostate [facility])  Had radical prostatectomy and did well. Saw urology in October and got good report. He did not have to have any radiation.     New complaints: None today  Social History: [place] and has [place] and works with hay    Review of Systems  Constitutional: Negative for activity change and appetite change.  HENT: Negative.   Eyes: Negative for pain.  Respiratory: Negative for shortness of breath.   Cardiovascular: Negative for chest pain, palpitations and leg swelling.  Gastrointestinal: Negative for abdominal pain.  Endocrine: Negative for polydipsia.  Genitourinary: Negative.   Skin: Negative for rash.  Neurological: Negative for dizziness, weakness and headaches.  Hematological: Does not bruise/bleed easily.  Psychiatric/Behavioral: Negative.   All other systems reviewed and are negative.      Objective:   Physical Exam  Constitutional: He is oriented to person, place, and time. He appears well-developed and well-nourished.  HENT:  Head: Normocephalic.  Right Ear: External ear normal.  Left Ear: External ear normal.  Nose:  Nose normal.  Mouth/Throat: Oropharynx is clear and moist.  Eyes: EOM are normal. Pupils are equal, round, and reactive to light.  Neck: Normal range of motion. Neck supple. No JVD present. No thyromegaly present.  Cardiovascular: Normal rate, regular rhythm, normal heart sounds and intact distal pulses. Exam reveals no gallop and no friction rub.  No murmur heard. Pulmonary/Chest: Effort normal and breath sounds normal. No respiratory distress. He has no wheezes. He has no rales. He exhibits no tenderness.  Abdominal: Soft. Bowel sounds are normal. He exhibits no mass. There is no tenderness.  Genitourinary: Prostate normal and penis normal.  Musculoskeletal: Normal range of motion. He exhibits no edema.  Lymphadenopathy:    He has no cervical adenopathy.  Neurological: He is alert and oriented to person, place, and time. No cranial nerve deficit.  Skin: Skin is warm and dry.  Psychiatric: He has a normal mood and affect. His behavior is normal. Judgment and thought content normal.    BP 119/71   Pulse (!) 48   Temp (!) 96.8 F (36 C) (Oral)   Ht '5\' 11"'  (1.803 m)   Wt 190 lb (86.2 kg)   BMI 26.50 kg/m   Chest x ray- no cardiopulmonary abnormalities noted  Ekg- sinus bradycardia- not symptomatic-Nicolas Hassell Done, FNP      Assessment & Plan:  1. Annual physical exam - CBC with Differential/Platelet  2. Essential hypertension Low sodium diet - lisinopril (PRINIVIL,ZESTRIL) 10 MG tablet; TAKE 1 TABLET (10 MG TOTAL) BY MOUTH DAILY.  Dispense: 90 tablet; Refill: 3 - CMP14+EGFR - EKG 12-Lead  3. Hyperlipidemia with target LDL less than 100 Low fat diet - Lipid panel  4. Malignant neoplasm of prostate ([place]) Keep follo wup with urology - DG Chest 2 View; Future  5. BMI 26.0-26.9,adult Discussed diet and exercise for person with BMI >25 Will recheck weight in 3-6 months   Scheduled for colonoscopy at end of month Labs pending Health maintenance reviewed Diet and  exercise encouraged Continue all meds Follow up  In 6 months   [place], FNP

## 2017-02-15 NOTE — Patient Instructions (Signed)

## 2017-02-16 ENCOUNTER — Encounter: Payer: Self-pay | Admitting: Internal Medicine

## 2017-02-16 LAB — CMP14+EGFR
ALT: 15 IU/L (ref 0–44)
AST: 23 IU/L (ref 0–40)
Albumin/Globulin Ratio: 1.4 (ref 1.2–2.2)
Albumin: 4.4 g/dL (ref 3.5–4.8)
Alkaline Phosphatase: 90 IU/L (ref 39–117)
BILIRUBIN TOTAL: 0.5 mg/dL (ref 0.0–1.2)
BUN/Creatinine Ratio: 26 — ABNORMAL HIGH (ref 10–24)
BUN: 23 mg/dL (ref 8–27)
CHLORIDE: 104 mmol/L (ref 96–106)
CO2: 20 mmol/L (ref 20–29)
Calcium: 10 mg/dL (ref 8.6–10.2)
Creatinine, Ser: 0.88 mg/dL (ref 0.76–1.27)
GFR calc non Af Amer: 85 mL/min/{1.73_m2} (ref >59)
GFR, EST AFRICAN AMERICAN: 99 mL/min/{1.73_m2} (ref >59)
GLOBULIN, TOTAL: 3.2 g/dL (ref 1.5–4.5)
Glucose: 90 mg/dL (ref 65–99)
POTASSIUM: 4.7 mmol/L (ref 3.5–5.2)
SODIUM: 143 mmol/L (ref 134–144)
Total Protein: 7.6 g/dL (ref 6.0–8.5)

## 2017-02-16 LAB — LIPID PANEL
Chol/HDL Ratio: 3.8 ratio (ref 0.0–5.0)
Cholesterol, Total: 218 mg/dL — ABNORMAL HIGH (ref 100–199)
HDL: 58 mg/dL (ref >39)
LDL Calculated: 142 mg/dL — ABNORMAL HIGH (ref 0–99)
TRIGLYCERIDES: 90 mg/dL (ref 0–149)
VLDL Cholesterol Cal: 18 mg/dL (ref 5–40)

## 2017-02-16 LAB — CBC WITH DIFFERENTIAL/PLATELET
BASOS ABS: 0 10*3/uL (ref 0.0–0.2)
Basos: 1 %
EOS (ABSOLUTE): 0.6 10*3/uL — ABNORMAL HIGH (ref 0.0–0.4)
EOS: 7 %
HEMATOCRIT: 43.3 % (ref 37.5–51.0)
HEMOGLOBIN: 14 g/dL (ref 13.0–17.7)
IMMATURE GRANS (ABS): 0 10*3/uL (ref 0.0–0.1)
Immature Granulocytes: 1 %
LYMPHS: 34 %
Lymphocytes Absolute: 2.8 10*3/uL (ref 0.7–3.1)
MCH: 29.4 pg (ref 26.6–33.0)
MCHC: 32.3 g/dL (ref 31.5–35.7)
MCV: 91 fL (ref 79–97)
MONOCYTES: 10 %
Monocytes Absolute: 0.8 10*3/uL (ref 0.1–0.9)
Neutrophils Absolute: 4 10*3/uL (ref 1.4–7.0)
Neutrophils: 47 %
Platelets: 293 10*3/uL (ref 150–379)
RBC: 4.76 x10E6/uL (ref 4.14–5.80)
RDW: 13.8 % (ref 12.3–15.4)
WBC: 8.2 10*3/uL (ref 3.4–10.8)

## 2017-02-26 ENCOUNTER — Encounter: Payer: Self-pay | Admitting: Internal Medicine

## 2017-02-26 ENCOUNTER — Ambulatory Visit (AMBULATORY_SURGERY_CENTER): Payer: Medicare Other | Admitting: Internal Medicine

## 2017-02-26 ENCOUNTER — Other Ambulatory Visit: Payer: Self-pay

## 2017-02-26 VITALS — BP 108/53 | HR 50 | Temp 98.4°F | Resp 16 | Ht 71.0 in | Wt 190.0 lb

## 2017-02-26 DIAGNOSIS — K635 Polyp of colon: Secondary | ICD-10-CM | POA: Diagnosis not present

## 2017-02-26 DIAGNOSIS — Z1211 Encounter for screening for malignant neoplasm of colon: Secondary | ICD-10-CM | POA: Diagnosis not present

## 2017-02-26 DIAGNOSIS — D122 Benign neoplasm of ascending colon: Secondary | ICD-10-CM | POA: Diagnosis not present

## 2017-02-26 DIAGNOSIS — D125 Benign neoplasm of sigmoid colon: Secondary | ICD-10-CM

## 2017-02-26 DIAGNOSIS — Z86010 Personal history of colonic polyps: Secondary | ICD-10-CM

## 2017-02-26 DIAGNOSIS — K5140 Inflammatory polyps of colon without complications: Secondary | ICD-10-CM | POA: Diagnosis not present

## 2017-02-26 DIAGNOSIS — D120 Benign neoplasm of cecum: Secondary | ICD-10-CM

## 2017-02-26 MED ORDER — SODIUM CHLORIDE 0.9 % IV SOLN: 500.0000 mL | Freq: Once | INTRAVENOUS | Status: AC

## 2017-02-26 NOTE — Progress Notes (Signed)
Pt's states no medical or surgical changes since previsit or office visit. 

## 2017-02-26 NOTE — Patient Instructions (Signed)
YOU HAD AN ENDOSCOPIC PROCEDURE TODAY AT [facility]:   Refer to the procedure report that was given to you for any specific questions about what was found during the examination.  If the procedure report does not answer your questions, please call your gastroenterologist to clarify.  If you requested that your care partner not be given the details of your procedure findings, then the procedure report has been included in a sealed envelope for you to review at your convenience later.  YOU SHOULD EXPECT: Some feelings of bloating in the abdomen. Passage of more gas than usual.  Walking can help get rid of the air that was put into your GI tract during the procedure and reduce the bloating. If you had a lower endoscopy (such as a colonoscopy or flexible sigmoidoscopy) you may notice spotting of blood in your stool or on the toilet paper. If you underwent a bowel prep for your procedure, you may not have a normal bowel movement for a few days.  Please Note:  You might notice some irritation and congestion in your nose or some drainage.  This is from the oxygen used during your procedure.  There is no need for concern and it should clear up in a day or so.  SYMPTOMS TO REPORT IMMEDIATELY:   Following lower endoscopy (colonoscopy or flexible sigmoidoscopy):  Excessive amounts of blood in the stool  Significant tenderness or worsening of abdominal pains  Swelling of the abdomen that is new, acute  Fever of 100F or higher  For urgent or emergent issues, a gastroenterologist can be reached at any hour by calling [phone].   DIET:  We do recommend a small meal at first, but then you may proceed to your regular diet.  Drink plenty of fluids but you should avoid alcoholic beverages for 24 hours.  ACTIVITY:  You should plan to take it easy for the rest of today and you should NOT DRIVE or use heavy machinery until tomorrow (because of the sedation medicines used during the test).     FOLLOW UP: Our staff will call the number listed on your records the next business day following your procedure to check on you and address any questions or concerns that you may have regarding the information given to you following your procedure. If we do not reach you, we will leave a message.  However, if you are feeling well and you are not experiencing any problems, there is no need to return our call.  We will assume that you have returned to your regular daily activities without incident.  If any biopsies were taken you will be contacted by phone or by letter within the next 1-3 weeks.  Please call us at [phone] if you have not heard about the biopsies in 3 weeks.   Await for biopsy results to determine next repeat Colonoscopy screening Polyps (handout given) Hemorrhoids (handout given) Diverticulosis (handout given)   SIGNATURES/CONFIDENTIALITY: You and/or your care partner have signed paperwork which will be entered into your electronic medical record.  These signatures attest to the fact that that the information above on your After Visit Summary has been reviewed and is understood.  Full responsibility of the confidentiality of this discharge information lies with you and/or your care-partner.

## 2017-02-26 NOTE — Progress Notes (Signed)
A and O x3. Report to RN. Tolerated MAC anesthesia well.

## 2017-02-26 NOTE — Progress Notes (Signed)
Please refer to the blue and neon green sheets for instructions regarding diet and activity for the rest of today.Called to room to assist during endoscopic procedure.  Patient ID and intended procedure confirmed with present staff. Received instructions for my participation in the procedure from the performing physician. 

## 2017-02-26 NOTE — Op Note (Signed)
[facility] Patient Name: Nicolas Tyler Procedure Date: 02/26/2017 8:20 AM MRN: [id number] Endoscopist: Docia Chuck. Henrene Pastor , MD Age: 72 Referring MD:  Date of Birth: 1945-11-12 Gender: Male Account #: [id number] Procedure:                Colonoscopy, with cold snare polypectomy x 4 Indications:              High risk colon cancer surveillance: Personal                            history of non-advanced adenoma Previous                            examination2007 and 2013 Medicines:                Monitored Anesthesia Care Procedure:                Pre-Anesthesia Assessment:                           - Prior to the procedure, a History and Physical                            was performed, and patient medications and                            allergies were reviewed. The patient's tolerance of                            previous anesthesia was also reviewed. The risks                            and benefits of the procedure and the sedation                            options and risks were discussed with the patient.                            All questions were answered, and informed consent                            was obtained. Prior Anticoagulants: The patient has                            taken no previous anticoagulant or antiplatelet                            agents. ASA Grade Assessment: II - A patient with                            mild systemic disease. After reviewing the risks                            and benefits, the patient was deemed in  satisfactory condition to undergo the procedure.                           After obtaining informed consent, the colonoscope                            was passed under direct vision. Throughout the                            procedure, the patient's blood pressure, pulse, and                            oxygen saturations were monitored continuously. The                            Colonoscope was  introduced through the anus and                            advanced to the the cecum, identified by                            appendiceal orifice and ileocecal valve. The                            ileocecal valve, appendiceal orifice, and rectum                            were photographed. The quality of the bowel                            preparation was excellent. The colonoscopy was                            performed without difficulty. The patient tolerated                            the procedure well. The bowel preparation used was                            SUPREP. Scope In: 8:29:47 AM Scope Out: 8:43:17 AM Scope Withdrawal Time: 0 hours 11 minutes 15 seconds  Total Procedure Duration: 0 hours 13 minutes 30 seconds  Findings:                 Four polyps were found in the ascending colon and                            cecum. The polyps were 1 to 3 mm in size. These                            polyps were removed with a cold snare. Resection                            and retrieval were complete.  Diverticula were found in the sigmoid colon and                            ascending colon.                           Internal hemorrhoids were found during retroflexion.                           The exam was otherwise without abnormality on                            direct and retroflexion views. Complications:            No immediate complications. Estimated blood loss:                            None. Estimated Blood Loss:     Estimated blood loss: none. Impression:               - Four 1 to 3 mm polyps in the ascending colon and                            in the cecum, removed with a cold snare. Resected                            and retrieved.                           - Diverticulosis in the sigmoid colon and in the                            ascending colon.                           - Internal hemorrhoids.                           - The  examination was otherwise normal on direct                            and retroflexion views. Recommendation:           - Repeat colonoscopy in 5 years for surveillance.                           - Patient has a contact number available for                            emergencies. The signs and symptoms of potential                            delayed complications were discussed with the                            patient. Return to normal activities tomorrow.  Written discharge instructions were provided to the                            patient.                           - Resume previous diet.                           - Continue present medications.                           - Await pathology results. Docia Chuck. Henrene Pastor, MD 02/26/2017 8:48:19 AM This report has been signed electronically.

## 2017-02-27 ENCOUNTER — Telehealth: Payer: Self-pay

## 2017-02-27 NOTE — Telephone Encounter (Signed)
  Follow up Call-  Call back number 02/26/2017  Post procedure Call Back phone  # [phone]  Permission to leave phone message Yes  Some recent data might be hidden     Patient questions:  Do you have a fever, pain , or abdominal swelling? No. Pain Score  0 *  Have you tolerated food without any problems? Yes.    Have you been able to return to your normal activities? Yes.    Do you have any questions about your discharge instructions: Diet   No. Medications  No. Follow up visit  No.  Do you have questions or concerns about your Care? No.  Actions: * If pain score is 4 or above: No action needed, pain <4.

## 2017-03-03 ENCOUNTER — Encounter: Payer: Self-pay | Admitting: Internal Medicine

## 2017-03-05 DIAGNOSIS — L570 Actinic keratosis: Secondary | ICD-10-CM | POA: Diagnosis not present

## 2017-03-05 DIAGNOSIS — L818 Other specified disorders of pigmentation: Secondary | ICD-10-CM | POA: Diagnosis not present

## 2017-03-05 DIAGNOSIS — D0439 Carcinoma in situ of skin of other parts of face: Secondary | ICD-10-CM | POA: Diagnosis not present

## 2017-06-29 DIAGNOSIS — C61 Malignant neoplasm of prostate: Secondary | ICD-10-CM | POA: Diagnosis not present

## 2017-10-29 ENCOUNTER — Ambulatory Visit (INDEPENDENT_AMBULATORY_CARE_PROVIDER_SITE_OTHER): Payer: Medicare Other | Admitting: Family Medicine

## 2017-10-29 ENCOUNTER — Encounter: Payer: Self-pay | Admitting: Family Medicine

## 2017-10-29 VITALS — BP 121/64 | HR 80 | Temp 97.0°F | Ht 71.0 in | Wt 190.4 lb

## 2017-10-29 DIAGNOSIS — T162XXA Foreign body in left ear, initial encounter: Secondary | ICD-10-CM

## 2017-10-29 NOTE — Progress Notes (Signed)
Chief Complaint  Patient presents with  . Ear Problem    pt here today c/o left ear fullness    HPI  Patient presents today for a few weeks of feeling fullness in the left ear. Had an event where he pulled his hearing aid out and a small piece was missing. Not sure if it stayed in ear or not. Now having some congestion as well as decreased hearing.   PMH: Smoking status noted ROS: Per HPI  Objective: BP 121/64   Pulse 80   Temp (!) 97 F (36.1 C) (Oral)   Ht 5\' 11"  (1.803 m)   Wt 190 lb 6 oz (86.4 kg)   BMI 26.55 kg/m  Gen: NAD, alert, cooperative with exam HEENT: NCAT, EOMI, PERRL. Ear piece noted on exam. Washed out with lavage after loosening with forceps CV: RRR, good S1/S2, no murmur Neuro: Alert and oriented, No gross deficits  Assessment and plan:  1. Acute foreign body of left ear canal, initial encounter     Removed with immediate improvement noted by pt.     Follow up as needed.  Claretta Fraise, MD

## 2017-11-06 ENCOUNTER — Encounter: Payer: Self-pay | Admitting: *Deleted

## 2017-11-06 ENCOUNTER — Ambulatory Visit (INDEPENDENT_AMBULATORY_CARE_PROVIDER_SITE_OTHER): Payer: Medicare Other | Admitting: *Deleted

## 2017-11-06 VITALS — BP 118/65 | HR 46 | Ht 69.5 in | Wt 191.0 lb

## 2017-11-06 DIAGNOSIS — Z0000 Encounter for general adult medical examination without abnormal findings: Secondary | ICD-10-CM

## 2017-11-06 NOTE — Patient Instructions (Addendum)
  Mr. Nicolas Tyler , Thank you for taking time to come for your Medicare Wellness Visit. I appreciate your ongoing commitment to your health goals. Please review the following plan we discussed and let me know if I can assist you in the future.   These are the goals we discussed: Goals    . Decrease soda or juice intake    . Increase water intake    . Plan meals     Try to eat at least a small, healthy snack, like fruit and nuts during the day. Eat supper more slowly and decrease portion sizes. Limit snacking after supper.        This is a list of the screening recommended for you and due dates:  Health Maintenance  Topic Date Due  . Flu Shot  09/06/2017  . Stool Blood Test  02/26/2018  . Colon Cancer Screening  02/26/2022  . Tetanus Vaccine  04/22/2024  .  Hepatitis C: One time screening is recommended by Center for Disease Control  (CDC) for  adults born from [birth year] through 1967.   Completed  . Pneumonia vaccines  Completed   Use a  few drops of mineral oil in both ears to help with wax buildup Nasal saline as needed Flonase one spray in each nostril once a day Come in to see someone if your nosebleeds continue or if your ear doesn't improve

## 2017-11-06 NOTE — Progress Notes (Addendum)
Subjective:   Nicolas Tyler is a 72 y.o. male who presents for a Medicare Annual Wellness Visit. Nicolas Tyler lives at home with his wife. They have an adult daughter that lives in [place]. She has 2 sons, ages 76 and 57. 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 He is staying at their house in [place] with them right now because their parents are on a [place] vacation for 2 weeks. Last year he had plans to travel out [place] and eventually to [place]. He hasn't made it there yet but he and his wife did drive through the [place] last month and really enjoyed it. Spent a lot of time in [place] and [place]. 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 He gets most of his physical activity through working with them.     Review of Systems    Patient reports that his overall health is unchanged compared to last year.  Cardiac Risk Factors include: advanced age (>55men, >69 women);hypertension;male gender  HEENT: Left ear irritation. Had a piece of hearing aid lodged in it. It was removed and have relief but does still have some discomfort now. Inspected ear with otoscope. Canal normal with a small amount of dried cerumen attached to one side. TM visualized and was pearly gray. There did appear to be some fluid behind the TM but it was not bulging.    Has had 3 unprovoked nosebleeds over past 6 weeks. Has had some allergies and a runny nose as well. No trauma. Has not used any nasal sprays or medications. Bleeding was enough that it ran out of the left nostril. He was able to control it with pressure and it stopped easily within a few minutes.   All other systems negative       Current Medications (verified) Outpatient Encounter Medications as of 11/06/2017  Medication Sig  . aspirin 81 MG chewable tablet Chew by mouth daily.  Marland Kitchen lisinopril (PRINIVIL,ZESTRIL) 10 MG tablet TAKE 1  TABLET (10 MG TOTAL) BY MOUTH DAILY.  . Multiple Vitamin (MULTIVITAMIN) tablet Take 1 tablet by mouth daily.   Facility-Administered Encounter Medications as of 11/06/2017  Medication  . 0.9 %  sodium chloride infusion    Allergies (verified) Patient has no known allergies.   History: Past Medical History:  Diagnosis Date  . GERD (gastroesophageal reflux disease)   . Hyperlipidemia   . Hypertension   . Male erectile disorder   . Melanoma ([place])    PT DENIES  . Peyronie disease   . Prostate cancer ([place])   . Skin cancer    PT DENIES- wife states basal cell squamous cell    Past Surgical History:  Procedure Laterality Date  . COLONOSCOPY    . INGUINAL HERNIA REPAIR  2012  . LYMPHADENECTOMY Bilateral 08/02/2015   Procedure: BILATERAL PELVIC LYMPHADENECTOMY;  Surgeon: Raynelle Bring, MD;  Location: [facility];  Service: Urology;  Laterality: Bilateral;  . PENILE IMPLANT SURGERY  2008 OR 2009  . POLYPECTOMY    . PROSTATE BIOPSY  MARCH 2017  . ROBOT ASSISTED LAPAROSCOPIC RADICAL PROSTATECTOMY N/A 08/02/2015   Procedure: XI ROBOTIC ASSISTED LAPAROSCOPIC RADICAL PROSTATECTOMY LEVEL 3;  Surgeon: Raynelle Bring, MD;  Location: [facility];  Service: Urology;  Laterality: N/A;   Family History  Problem Relation Age of Onset  . Heart disease Father   . Coronary  artery disease Mother   . Irregular heart beat Brother   . Headache Brother   . Asthma Daughter   . Colon cancer Neg Hx   . Colon polyps Neg Hx   . Rectal cancer Neg Hx   . Stomach cancer Neg Hx   . Esophageal cancer Neg Hx    Social History   Socioeconomic History  . Marital status: Married    Spouse name: Not on file  . Number of children: 1  . Years of education: 26  . Highest education level: 12th grade  Occupational History  . 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 Occupation: Cow farming  Social Needs  . Financial resource strain: Not hard at all  . Food insecurity:    Worry: Never true    Inability: Never true    . Transportation needs:    Medical: No    Non-medical: No  Tobacco Use  . Smoking status: Former Smoker    Packs/day: 1.00    Years: 15.00    Pack years: 15.00    Types: Cigarettes    Last attempt to quit: 05/28/1974    Years since quitting: 43.4  . Smokeless tobacco: Never Used  Substance and Sexual Activity  . Alcohol use: Yes    Comment: occasionally   . Drug use: No  . Sexual activity: Yes  Lifestyle  . Physical activity:    Days per week: 7 days    Minutes per session: 30 min  . Stress: To some extent  Relationships  . Social connections:    Talks on phone: More than three times a week    Gets together: More than three times a week    Attends religious service: More than 4 times per year    Active member of club or organization: Yes    Attends meetings of clubs or organizations: More than 4 times per year    Relationship status: Married  Other Topics Concern  . Not on file  Social History Narrative  . Not on file    Tobacco Use No.  Clinical Intake:  Pre-visit preparation completed: No  Pain : No/denies pain     Nutritional Status: BMI 25 -29 Overweight Diabetes: No  How often do you need to have someone help you when you read instructions, pamphlets, or other written materials from your doctor or pharmacy?: 1 - Never What is the last grade level you completed in school?: Some college  Interpreter Needed?: No  Information entered by :: Colon Flattery, RN  Activities of Daily Living In your present state of health, do you have any difficulty performing the following activities: 11/06/2017  Hearing? N  Vision? N  Difficulty concentrating or making decisions? N  Walking or climbing stairs? N  Dressing or bathing? N  Doing errands, shopping? N  Preparing Food and eating ? N  Using the Toilet? N  In the past six months, have you accidently leaked urine? N  Do you have problems with loss of bowel control? N  Managing your Medications? N  Managing your  Finances? N  Housekeeping or managing your Housekeeping? N  Some recent data might be hidden     Diet Breakfast-Doesn't typically eat breakfast or eats it late Lunch-Usually skips lunch Supper-Typically eats a large meal at supper and may have a snack before bed Drinks water some, drinks some diet green tea or a diet coke   Exercise Current Exercise Habits: The patient has a physically strenous job, but has  no regular exercise apart from work., Exercise limited by: None identified    Depression Screen PHQ 2/9 Scores 11/06/2017 02/15/2017 08/07/2016 02/01/2016  PHQ - 2 Score 0 0 0 0     Fall Risk Fall Risk  11/06/2017 02/15/2017 08/07/2016 02/01/2016 05/13/2015  Falls in the past year? No No No No No  Number falls in past yr: - - - - -  Injury with Fall? - - - - -    Safety Is the patient's home free of loose throw rugs in walkways, pet beds, electrical cords, etc?   yes      Grab bars in the bathroom? no      Walkin shower? no      Shower Seat? no      Handrails on the stairs?   yes      Adequate lighting?   yes  Patient Care Team: Chevis Pretty, FNP as PCP - General (Nurse Practitioner) Franchot Gallo, MD as Consulting Physician (Urology) Raynelle Bring, MD as Consulting Physician (Urology) Melina Schools, OD (Optometry) Irene Shipper, MD as Consulting Physician (Gastroenterology)  Hospitalizations, surgeries, and ER visits in previous 12 months No hospitalizations, ER visits, or surgeries this past year.   Objective:    Today's Vitals   11/06/17 1016  BP: 118/65  Pulse: (!) 46  Weight: 191 lb (86.6 kg)  Height: 5' 9.5" (1.765 m)   Body mass index is 27.8 kg/m.  Advanced Directives 11/06/2017 02/26/2017 02/12/2017 08/07/2016 08/02/2015 07/28/2015 05/13/2015  Does Patient Have a Medical Advance Directive? Yes Yes Yes Yes Yes Yes No  Type of Advance Directive Living will [place];Living will;Out of facility DNR (pink MOST or yellow form) Living  will Living will;Healthcare Power of Real;Living will Living will -  Does patient want to make changes to medical advance directive? No - Patient declined No - Patient declined - No - Patient declined - No - Patient declined -  Copy of [place] in Chart? - No - copy requested - No - copy requested No - copy requested No - copy requested -  Would patient like information on creating a medical advance directive? - - - - - - No - patient declined information    Hearing/Vision  No hearing or vision deficits noted during visit.   Cognitive Function: MMSE - Mini Mental State Exam 11/06/2017 11/06/2017 08/07/2016  Orientation to time 5 5 5   Orientation to Place 5 5 5   Registration 3 - 3  Attention/ Calculation 5 - 5  Recall 2 - 3  Language- name 2 objects 2 - 2  Language- repeat 1 - 1  Language- follow 3 step command 3 - 2  Language- read & follow direction 1 - 1  Write a sentence 1 - 1  Copy design 1 - 1  Total score 29 - 29       Normal Cognitive Function Screening: Yes    Immunizations and Health Maintenance Immunization History  Administered Date(s) Administered  . Influenza, High Dose Seasonal PF 12/06/2014, 01/06/2016, 11/20/2016  . Influenza,inj,Quad PF,6+ Mos 12/19/2012, 12/25/2013  . Influenza-Unspecified 11/25/2016  . Pneumococcal Conjugate-13 05/27/2013  . Pneumococcal Polysaccharide-23 01/14/2015  . Tdap 04/23/2014  . Zoster 12/22/2015   Health Maintenance Due  Topic Date Due  . INFLUENZA VACCINE  09/06/2017   Health Maintenance  Topic Date Due  . INFLUENZA VACCINE  09/06/2017  . COLON CANCER SCREENING ANNUAL FOBT  02/26/2018  . COLONOSCOPY  02/26/2022  . TETANUS/TDAP  04/22/2024  . Hepatitis C Screening  Completed  . PNA vac Low Risk Adult  Completed        Assessment:   This is a routine wellness examination for Nicolas Tyler.      Plan:    Goals    . Decrease soda or juice intake    . Increase water  intake    . Plan meals     Try to eat at least a small, healthy snack, like fruit and nuts during the day. Eat supper more slowly and decrease portion sizes. Limit snacking after supper.         Health Maintenance Recommendations: Influenza vaccine  Additional Screening Recommendations: Lung: Low Dose CT Chest recommended if Age 73-80 years, 30 pack-year currently smoking OR have quit w/in 15years. Patient does not qualify. Hepatitis C Screening recommended: no  Keep f/u with Chevis Pretty, FNP and any other specialty appointments you may have Continue current medications Follow up with PCP or emergency care provider if nosebleeds return Bring completed living will to put on file Recommended nasal saline. Blow nose very gently if necessary.  Flonase may help with eustachian tube dysfunction. Cautioned that Flonase can cause nosebleeds.  Use a few drops of mineral oil to help lubricate and soften wax. Can do this as needed. Move carefully to avoid falls. Use assistive devices like a cane or walker if needed. Aim for at least 150 minutes of moderate activity a week.  Reading or puzzles are a good way to exercise your brain Stay connected with friends and family. Social connections are beneficial to your emotional and mental health.   I have personally reviewed and noted the following in the patient's chart:   . Medical and social history . Use of alcohol, tobacco or illicit drugs  . Current medications and supplements . Functional ability and status . Nutritional status . Physical activity . Advanced directives . List of other physicians . Hospitalizations, surgeries, and ER visits in previous 12 months . Vitals . Screenings to include cognitive, depression, and falls . Referrals and appointments  In addition, I have reviewed and discussed with patient certain preventive protocols, quality metrics, and best practice recommendations. A written personalized care plan for  preventive services as well as general preventive health recommendations were provided to patient.     Chong Sicilian, RN   11/06/2017    I have reviewed and agree with the above AWV documentation.   Mary-Margaret Hassell Done, FNP

## 2017-12-20 IMAGING — DX DG CHEST 2V
2 series · 2 of 2 positions shown · non-contrast
Comparison: PA and lateral chest x-ray May 27, 2013

CLINICAL DATA: Preoperative examination for robotic prostatectomy
for prostate malignancy; former smoker common no current chest
complaints.

EXAM:
CHEST  2 VIEW

[chest pa]
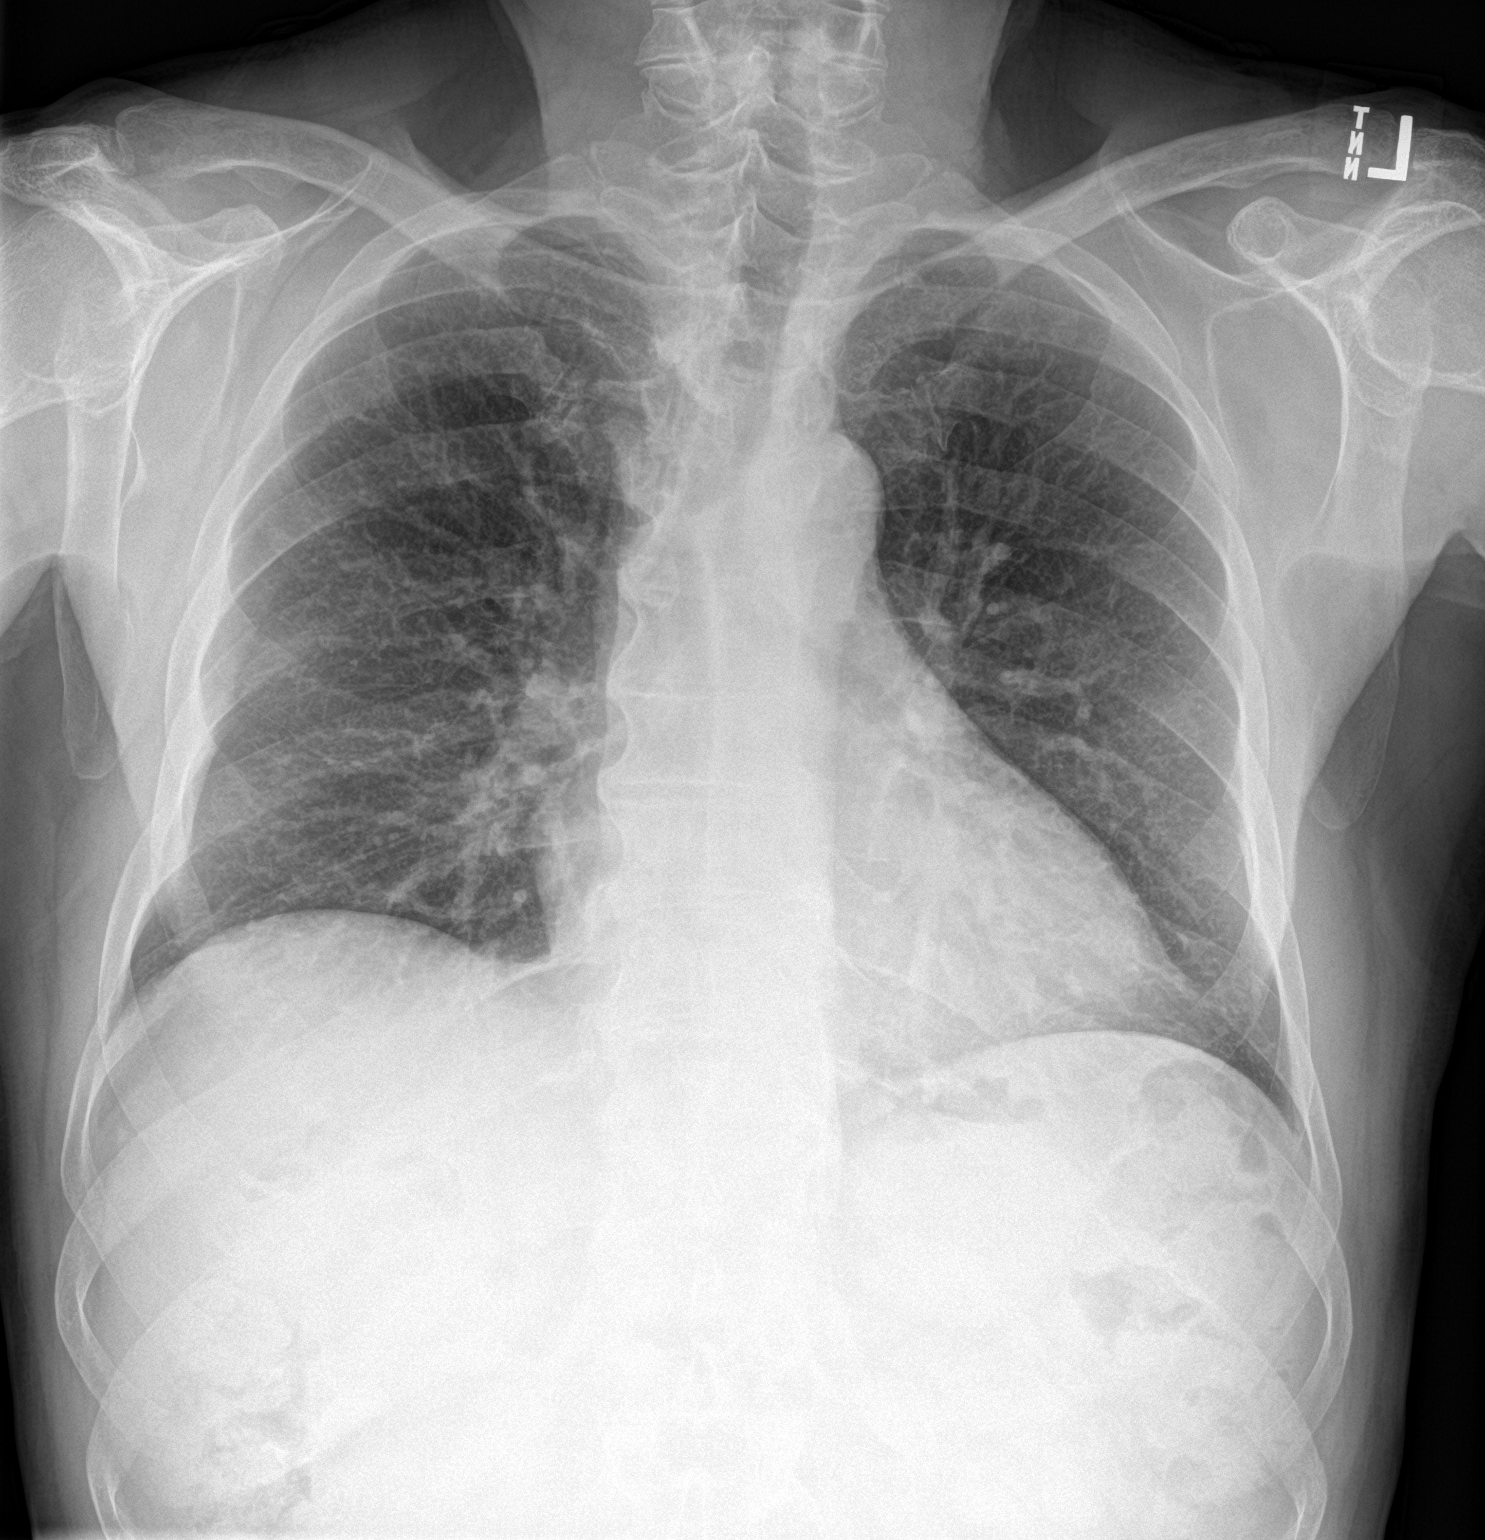

[chest lat]
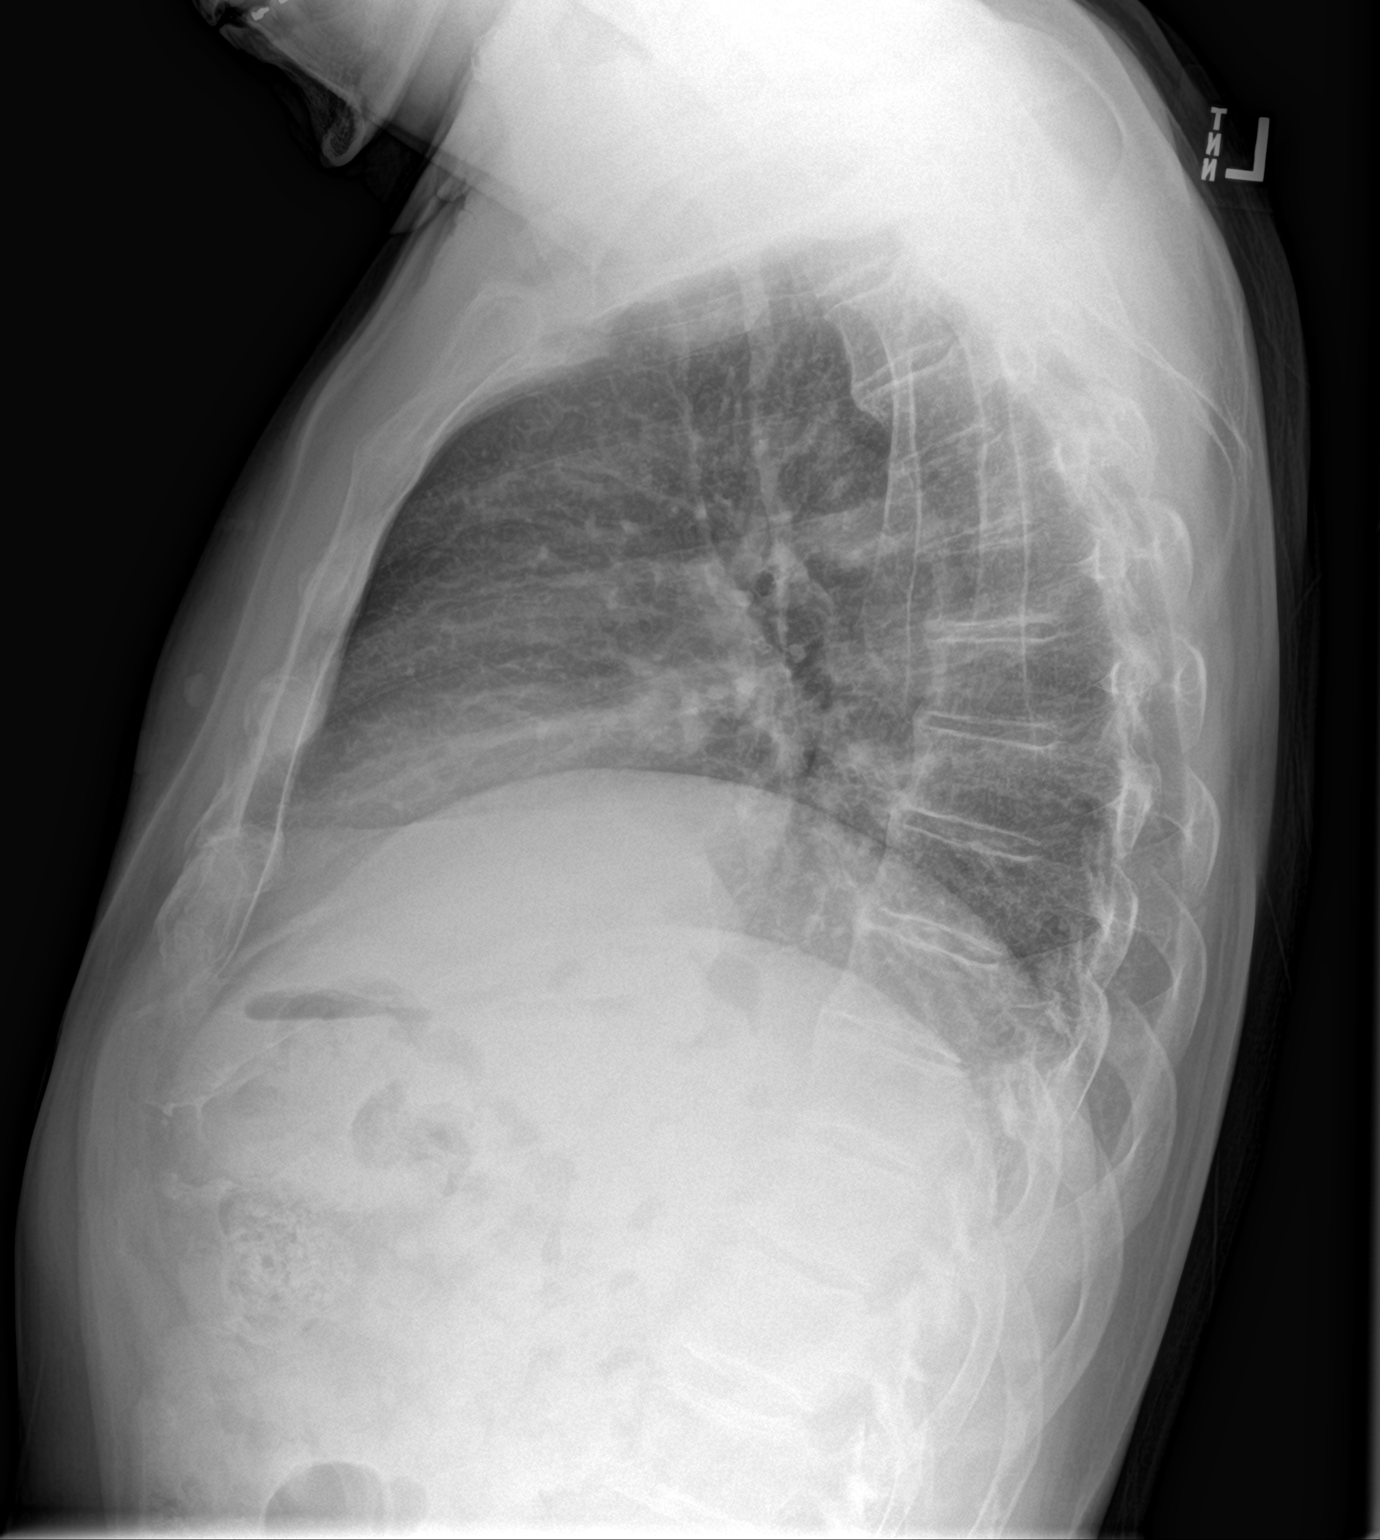

[2 of 2 positions shown; findings below may reference images not displayed]

FINDINGS: The lungs are adequately inflated and clear. The heart is top-normal
in size. The pulmonary vascularity is normal. The mediastinum is
normal in width. There is no pleural effusion. There is minimal
dextrocurvature centered in the mid thoracic spine which is stable.
There is calcification of portions of the anterior longitudinal
ligament of the thoracic spine.
IMPRESSION: There is no active cardiopulmonary disease.

## 2018-02-20 ENCOUNTER — Other Ambulatory Visit: Payer: Self-pay | Admitting: Dermatology

## 2018-02-20 DIAGNOSIS — L570 Actinic keratosis: Secondary | ICD-10-CM | POA: Diagnosis not present

## 2018-02-20 DIAGNOSIS — D229 Melanocytic nevi, unspecified: Secondary | ICD-10-CM | POA: Diagnosis not present

## 2018-02-20 DIAGNOSIS — D485 Neoplasm of uncertain behavior of skin: Secondary | ICD-10-CM | POA: Diagnosis not present

## 2018-02-20 DIAGNOSIS — L820 Inflamed seborrheic keratosis: Secondary | ICD-10-CM | POA: Diagnosis not present

## 2018-02-20 DIAGNOSIS — L821 Other seborrheic keratosis: Secondary | ICD-10-CM | POA: Diagnosis not present

## 2018-03-18 ENCOUNTER — Other Ambulatory Visit: Payer: Self-pay | Admitting: Nurse Practitioner

## 2018-03-18 DIAGNOSIS — I10 Essential (primary) hypertension: Secondary | ICD-10-CM

## 2018-03-27 DIAGNOSIS — L509 Urticaria, unspecified: Secondary | ICD-10-CM | POA: Diagnosis not present

## 2018-03-27 DIAGNOSIS — L309 Dermatitis, unspecified: Secondary | ICD-10-CM | POA: Diagnosis not present

## 2018-04-10 ENCOUNTER — Other Ambulatory Visit: Payer: Self-pay | Admitting: Nurse Practitioner

## 2018-04-10 DIAGNOSIS — I10 Essential (primary) hypertension: Secondary | ICD-10-CM

## 2018-04-11 NOTE — Telephone Encounter (Signed)
[id number]-cb 3/5 

## 2018-04-11 NOTE — Telephone Encounter (Signed)
MMM. NTBS 30 days given 03/18/18

## 2018-04-22 ENCOUNTER — Other Ambulatory Visit: Payer: Self-pay

## 2018-04-22 ENCOUNTER — Encounter: Payer: Self-pay | Admitting: Nurse Practitioner

## 2018-04-22 ENCOUNTER — Ambulatory Visit (INDEPENDENT_AMBULATORY_CARE_PROVIDER_SITE_OTHER): Payer: Medicare Other | Admitting: Nurse Practitioner

## 2018-04-22 VITALS — BP 115/65 | HR 44 | Temp 97.1°F | Ht 69.0 in | Wt 186.0 lb

## 2018-04-22 DIAGNOSIS — C61 Malignant neoplasm of prostate: Secondary | ICD-10-CM | POA: Diagnosis not present

## 2018-04-22 DIAGNOSIS — I10 Essential (primary) hypertension: Secondary | ICD-10-CM | POA: Diagnosis not present

## 2018-04-22 DIAGNOSIS — E785 Hyperlipidemia, unspecified: Secondary | ICD-10-CM

## 2018-04-22 DIAGNOSIS — Z6826 Body mass index (BMI) 26.0-26.9, adult: Secondary | ICD-10-CM | POA: Diagnosis not present

## 2018-04-22 MED ORDER — LISINOPRIL 10 MG PO TABS: ORAL_TABLET | ORAL | 1 refills | Status: DC

## 2018-04-22 NOTE — Progress Notes (Signed)
Subjective:    Patient ID: Nicolas Tyler, male    DOB: [birth year], 73 y.o.   MRN: [id number]   Chief Complaint: Medical Management of Chronic Issues   HPI:  1. Essential hypertension  No c/o chast pain, sob or headache. Does not check blood pressure at h ome. BP Readings from Last 3 Encounters:  04/22/18 115/65  11/06/17 118/65  10/29/17 121/64     2. Hyperlipidemia with target LDL less than 100  Watches diet and stays very active.  3. Malignant neoplasm of prostate ([place])  Had prostatectomy in 2017. His last PSA checks have been good. No problems voiding  4. BMI 26.0-26.9,adult  Weight is down 4lbs    Outpatient Encounter Medications as of 04/22/2018  Medication Sig  . aspirin 81 MG chewable tablet Chew by mouth daily.  Marland Kitchen lisinopril (PRINIVIL,ZESTRIL) 10 MG tablet TAKE 1 TABLET BY MOUTH EVERY DAY (Needs to be seen before next refill)  . Multiple Vitamin (MULTIVITAMIN) tablet Take 1 tablet by mouth daily.       New complaints: None today  Social history: Still working with cattle and hay. Wife is still living   Review of Systems  Constitutional: Negative for activity change and appetite change.  HENT: Negative.   Eyes: Negative for pain.  Respiratory: Negative for shortness of breath.   Cardiovascular: Negative for chest pain, palpitations and leg swelling.  Gastrointestinal: Negative for abdominal pain.  Endocrine: Negative for polydipsia.  Genitourinary: Negative.   Skin: Negative for rash.  Neurological: Negative for dizziness, weakness and headaches.  Hematological: Does not bruise/bleed easily.  Psychiatric/Behavioral: Negative.   All other systems reviewed and are negative.      Objective:   Physical Exam Vitals signs and nursing note reviewed.  Constitutional:      Appearance: Normal appearance. He is well-developed.  HENT:     Head: Normocephalic.     Nose: Nose normal.  Eyes:     Pupils: Pupils are equal, round, and reactive to light.   Neck:     Musculoskeletal: Normal range of motion and neck supple.     Thyroid: No thyroid mass or thyromegaly.     Vascular: No carotid bruit or JVD.     Trachea: Phonation normal.  Cardiovascular:     Rate and Rhythm: Normal rate and regular rhythm.  Pulmonary:     Effort: Pulmonary effort is normal. No respiratory distress.     Breath sounds: Normal breath sounds.  Abdominal:     General: Bowel sounds are normal.     Palpations: Abdomen is soft.     Tenderness: There is no abdominal tenderness.  Musculoskeletal: Normal range of motion.  Lymphadenopathy:     Cervical: No cervical adenopathy.  Skin:    General: Skin is warm and dry.  Neurological:     Mental Status: He is alert and oriented to person, place, and time.  Psychiatric:        Behavior: Behavior normal.        Thought Content: Thought content normal.        Judgment: Judgment normal.    BP 115/65   Pulse (!) 44   Temp (!) 97.1 F (36.2 C) (Oral)   Ht '5\' 9"'  (1.753 m)   Wt 186 lb (84.4 kg)   BMI 27.47 kg/m         Assessment & Plan:  Nicolas Tyler comes in today with chief complaint of Medical Management of Chronic Issues   Diagnosis and  orders addressed:  1. Essential hypertension Low sodium diet - lisinopril (PRINIVIL,ZESTRIL) 10 MG tablet; TAKE 1 TABLET BY MOUTH EVERY DAY (Needs to be seen before next refill)  Dispense: 90 tablet; Refill: 1 - CMP14+EGFR  2. Hyperlipidemia with target LDL less than 100 Low fat diet - Lipid panel  3. Malignant neoplasm of prostate ([facility])  4. BMI 26.0-26.9,adult Discussed diet and exercise for person with BMI >25 Will recheck weight in 3-6 months   Labs pending Health Maintenance reviewed Diet and exercise encouraged  Follow up plan: 6 months   Mary-Margaret Hassell Done, FNP

## 2018-04-22 NOTE — Patient Instructions (Signed)
DASH Eating Plan  DASH stands for "Dietary Approaches to Stop Hypertension." The DASH eating plan is a healthy eating plan that has been shown to reduce high blood pressure (hypertension). It may also reduce your risk for type 2 diabetes, heart disease, and stroke. The DASH eating plan may also help with weight loss.  What are tips for following this plan?    General guidelines   Avoid eating more than 2,300 mg (milligrams) of salt (sodium) a day. If you have hypertension, you may need to reduce your sodium intake to 1,500 mg a day.   Limit alcohol intake to no more than 1 drink a day for nonpregnant women and 2 drinks a day for men. One drink equals 12 oz of beer, 5 oz of wine, or 1 oz of hard liquor.   Work with your health care provider to maintain a healthy body weight or to lose weight. Ask what an ideal weight is for you.   Get at least 30 minutes of exercise that causes your heart to beat faster (aerobic exercise) most days of the week. Activities may include walking, swimming, or biking.   Work with your health care provider or diet and nutrition specialist (dietitian) to adjust your eating plan to your individual calorie needs.  Reading food labels     Check food labels for the amount of sodium per serving. Choose foods with less than 5 percent of the Daily Value of sodium. Generally, foods with less than 300 mg of sodium per serving fit into this eating plan.   To find whole grains, look for the word "whole" as the first word in the ingredient list.  Shopping   Buy products labeled as "low-sodium" or "no salt added."   Buy fresh foods. Avoid canned foods and premade or frozen meals.  Cooking   Avoid adding salt when cooking. Use salt-free seasonings or herbs instead of table salt or sea salt. Check with your health care provider or pharmacist before using salt substitutes.   Do not fry foods. Cook foods using healthy methods such as baking, boiling, grilling, and broiling instead.   Cook with  heart-healthy oils, such as olive, canola, soybean, or sunflower oil.  Meal planning   Eat a balanced diet that includes:  ? 5 or more servings of fruits and vegetables each day. At each meal, try to fill half of your plate with fruits and vegetables.  ? Up to 6-8 servings of whole grains each day.  ? Less than 6 oz of lean meat, poultry, or fish each day. A 3-oz serving of meat is about the same size as a deck of cards. One egg equals 1 oz.  ? 2 servings of low-fat dairy each day.  ? A serving of nuts, seeds, or beans 5 times each week.  ? Heart-healthy fats. Healthy fats called Omega-3 fatty acids are found in foods such as flaxseeds and coldwater fish, like sardines, salmon, and mackerel.   Limit how much you eat of the following:  ? Canned or prepackaged foods.  ? Food that is high in trans fat, such as fried foods.  ? Food that is high in saturated fat, such as fatty meat.  ? Sweets, desserts, sugary drinks, and other foods with added sugar.  ? Full-fat dairy products.   Do not salt foods before eating.   Try to eat at least 2 vegetarian meals each week.   Eat more home-cooked food and less restaurant, buffet, and fast food.     When eating at a restaurant, ask that your food be prepared with less salt or no salt, if possible.  What foods are recommended?  The items listed may not be a complete list. Talk with your dietitian about what dietary choices are best for you.  Grains  Whole-grain or whole-wheat bread. Whole-grain or whole-wheat pasta. Brown rice. Oatmeal. Quinoa. Bulgur. Whole-grain and low-sodium cereals. Pita bread. Low-fat, low-sodium crackers. Whole-wheat flour tortillas.  Vegetables  Fresh or frozen vegetables (raw, steamed, roasted, or grilled). Low-sodium or reduced-sodium tomato and vegetable juice. Low-sodium or reduced-sodium tomato sauce and tomato paste. Low-sodium or reduced-sodium canned vegetables.  Fruits  All fresh, dried, or frozen fruit. Canned fruit in natural juice (without  added sugar).  Meat and other protein foods  Skinless chicken or turkey. Ground chicken or turkey. Pork with fat trimmed off. Fish and seafood. Egg whites. Dried beans, peas, or lentils. Unsalted nuts, nut butters, and seeds. Unsalted canned beans. Lean cuts of beef with fat trimmed off. Low-sodium, lean deli meat.  Dairy  Low-fat (1%) or fat-free (skim) milk. Fat-free, low-fat, or reduced-fat cheeses. Nonfat, low-sodium ricotta or cottage cheese. Low-fat or nonfat yogurt. Low-fat, low-sodium cheese.  Fats and oils  Soft margarine without trans fats. Vegetable oil. Low-fat, reduced-fat, or light mayonnaise and salad dressings (reduced-sodium). Canola, safflower, olive, soybean, and sunflower oils. Avocado.  Seasoning and other foods  Herbs. Spices. Seasoning mixes without salt. Unsalted popcorn and pretzels. Fat-free sweets.  What foods are not recommended?  The items listed may not be a complete list. Talk with your dietitian about what dietary choices are best for you.  Grains  Baked goods made with fat, such as croissants, muffins, or some breads. Dry pasta or rice meal packs.  Vegetables  Creamed or fried vegetables. Vegetables in a cheese sauce. Regular canned vegetables (not low-sodium or reduced-sodium). Regular canned tomato sauce and paste (not low-sodium or reduced-sodium). Regular tomato and vegetable juice (not low-sodium or reduced-sodium). Pickles. Olives.  Fruits  Canned fruit in a light or heavy syrup. Fried fruit. Fruit in cream or butter sauce.  Meat and other protein foods  Fatty cuts of meat. Ribs. Fried meat. Bacon. Sausage. Bologna and other processed lunch meats. Salami. Fatback. Hotdogs. Bratwurst. Salted nuts and seeds. Canned beans with added salt. Canned or smoked fish. Whole eggs or egg yolks. Chicken or turkey with skin.  Dairy  Whole or 2% milk, cream, and half-and-half. Whole or full-fat cream cheese. Whole-fat or sweetened yogurt. Full-fat cheese. Nondairy creamers. Whipped toppings.  Processed cheese and cheese spreads.  Fats and oils  Butter. Stick margarine. Lard. Shortening. Ghee. Bacon fat. Tropical oils, such as coconut, palm kernel, or palm oil.  Seasoning and other foods  Salted popcorn and pretzels. Onion salt, garlic salt, seasoned salt, table salt, and sea salt. Worcestershire sauce. Tartar sauce. Barbecue sauce. Teriyaki sauce. Soy sauce, including reduced-sodium. Steak sauce. Canned and packaged gravies. Fish sauce. Oyster sauce. Cocktail sauce. Horseradish that you find on the shelf. Ketchup. Mustard. Meat flavorings and tenderizers. Bouillon cubes. Hot sauce and Tabasco sauce. Premade or packaged marinades. Premade or packaged taco seasonings. Relishes. Regular salad dressings.  Where to find more information:   National Heart, Lung, and Blood Institute: www.nhlbi.nih.gov   American Heart Association: www.heart.org  Summary   The DASH eating plan is a healthy eating plan that has been shown to reduce high blood pressure (hypertension). It may also reduce your risk for type 2 diabetes, heart disease, and stroke.   With the   DASH eating plan, you should limit salt (sodium) intake to 2,300 mg a day. If you have hypertension, you may need to reduce your sodium intake to 1,500 mg a day.   When on the DASH eating plan, aim to eat more fresh fruits and vegetables, whole grains, lean proteins, low-fat dairy, and heart-healthy fats.   Work with your health care provider or diet and nutrition specialist (dietitian) to adjust your eating plan to your individual calorie needs.  This information is not intended to replace advice given to you by your health care provider. Make sure you discuss any questions you have with your health care provider.  Document Released: 01/12/2011 Document Revised: 01/17/2016 Document Reviewed: 01/17/2016  Elsevier Interactive Patient Education  2019 Elsevier Inc.

## 2018-04-23 LAB — LIPID PANEL
Chol/HDL Ratio: 3.7 ratio (ref 0.0–5.0)
Cholesterol, Total: 181 mg/dL (ref 100–199)
HDL: 49 mg/dL (ref >39)
LDL Calculated: 109 mg/dL — ABNORMAL HIGH (ref 0–99)
TRIGLYCERIDES: 114 mg/dL (ref 0–149)
VLDL CHOLESTEROL CAL: 23 mg/dL (ref 5–40)

## 2018-04-23 LAB — CMP14+EGFR
ALT: 15 IU/L (ref 0–44)
AST: 19 IU/L (ref 0–40)
Albumin/Globulin Ratio: 1.5 (ref 1.2–2.2)
Albumin: 4.3 g/dL (ref 3.7–4.7)
Alkaline Phosphatase: 90 IU/L (ref 39–117)
BUN/Creatinine Ratio: 21 (ref 10–24)
BUN: 20 mg/dL (ref 8–27)
Bilirubin Total: 0.2 mg/dL (ref 0.0–1.2)
CO2: 25 mmol/L (ref 20–29)
Calcium: 9.3 mg/dL (ref 8.6–10.2)
Chloride: 100 mmol/L (ref 96–106)
Creatinine, Ser: 0.96 mg/dL (ref 0.76–1.27)
GFR calc Af Amer: 90 mL/min/{1.73_m2} (ref >59)
GFR calc non Af Amer: 78 mL/min/{1.73_m2} (ref >59)
Globulin, Total: 2.9 g/dL (ref 1.5–4.5)
Glucose: 88 mg/dL (ref 65–99)
Potassium: 4.2 mmol/L (ref 3.5–5.2)
Sodium: 139 mmol/L (ref 134–144)
Total Protein: 7.2 g/dL (ref 6.0–8.5)

## 2018-10-13 ENCOUNTER — Other Ambulatory Visit: Payer: Self-pay | Admitting: Nurse Practitioner

## 2018-10-13 DIAGNOSIS — I10 Essential (primary) hypertension: Secondary | ICD-10-CM

## 2018-10-24 ENCOUNTER — Other Ambulatory Visit: Payer: Self-pay

## 2018-10-24 ENCOUNTER — Ambulatory Visit (INDEPENDENT_AMBULATORY_CARE_PROVIDER_SITE_OTHER): Payer: Medicare Other | Admitting: Nurse Practitioner

## 2018-10-24 ENCOUNTER — Encounter: Payer: Self-pay | Admitting: Nurse Practitioner

## 2018-10-24 VITALS — BP 131/61 | HR 42 | Temp 97.8°F | Resp 16 | Ht 69.0 in | Wt 187.0 lb

## 2018-10-24 DIAGNOSIS — Z6826 Body mass index (BMI) 26.0-26.9, adult: Secondary | ICD-10-CM

## 2018-10-24 DIAGNOSIS — I10 Essential (primary) hypertension: Secondary | ICD-10-CM | POA: Diagnosis not present

## 2018-10-24 DIAGNOSIS — C61 Malignant neoplasm of prostate: Secondary | ICD-10-CM

## 2018-10-24 DIAGNOSIS — E785 Hyperlipidemia, unspecified: Secondary | ICD-10-CM | POA: Diagnosis not present

## 2018-10-24 MED ORDER — LISINOPRIL 10 MG PO TABS: 10.0000 mg | ORAL_TABLET | Freq: Every day | ORAL | 1 refills | Status: DC

## 2018-10-24 NOTE — Progress Notes (Signed)
Subjective:    Patient ID: Nicolas Tyler, male    DOB: 09-09-1945, 73 y.o.   MRN: [id number]   Chief Complaint: Medical Management of Chronic Issues    HPI:  1. Essential hypertension No c/o chest pain, sob or headache. Does not check bloodpressure at home. BP Readings from Last 3 Encounters:  10/24/18 131/61  04/22/18 115/65  11/06/17 118/65     2. Hyperlipidemia with target LDL less than 100 Watches diet and exercises3-4x a week. He owns a farm and stays busy. Lab Results  Component Value Date   CHOL 181 04/22/2018   HDL 49 04/22/2018   LDLCALC 109 (H) 04/22/2018   TRIG 114 04/22/2018   CHOLHDL 3.7 04/22/2018    3. Malignant neoplasm of prostate ([place]) Had prostatectomy in 2017 and has done well. PSA levels have been very low.see urologist 1x per year.  4. BMI 26.0-26.9,adult No recent weight changes. Wt Readings from Last 3 Encounters:  10/24/18 187 lb (84.8 kg)  04/22/18 186 lb (84.4 kg)  11/06/17 191 lb (86.6 kg)   BMI Readings from Last 3 Encounters:  10/24/18 27.62 kg/m  04/22/18 27.47 kg/m  11/06/17 27.80 kg/m        Outpatient Encounter Medications as of 10/24/2018  Medication Sig  . aspirin 81 MG chewable tablet Chew by mouth daily.  Marland Kitchen lisinopril (ZESTRIL) 10 MG tablet TAKE 1 TABLET BY MOUTH EVERY DAY (NEEDS TO BE SEEN BEFORE NEXT REFILL)  . Multiple Vitamin (MULTIVITAMIN) tablet Take 1 tablet by mouth daily.     Past Surgical History:  Procedure Laterality Date  . COLONOSCOPY    . INGUINAL HERNIA REPAIR  2012  . LYMPHADENECTOMY Bilateral 08/02/2015   Procedure: BILATERAL PELVIC LYMPHADENECTOMY;  Surgeon: Raynelle Bring, MD;  Location: [facility];  Service: Urology;  Laterality: Bilateral;  . PENILE IMPLANT SURGERY  2008 OR 2009  . POLYPECTOMY    . PROSTATE BIOPSY  MARCH 2017  . ROBOT ASSISTED LAPAROSCOPIC RADICAL PROSTATECTOMY N/A 08/02/2015   Procedure: XI ROBOTIC ASSISTED LAPAROSCOPIC RADICAL PROSTATECTOMY LEVEL 3;  Surgeon: Raynelle Bring, MD;  Location: [facility];  Service: Urology;  Laterality: N/A;    Family History  Problem Relation Age of Onset  . Heart disease Father   . Coronary artery disease Mother   . Irregular heart beat Brother   . Headache Brother   . Asthma Daughter   . Colon cancer Neg Hx   . Colon polyps Neg Hx   . Rectal cancer Neg Hx   . Stomach cancer Neg Hx   . Esophageal cancer Neg Hx     New complaints: None today  Social history: His wife is a home body , so they do not get out and do much  Controlled substance contract: n/a    Review of Systems  Constitutional: Negative for activity change and appetite change.  HENT: Negative.   Eyes: Negative for pain.  Respiratory: Negative for shortness of breath.   Cardiovascular: Negative for chest pain, palpitations and leg swelling.  Gastrointestinal: Negative for abdominal pain.  Endocrine: Negative for polydipsia.  Genitourinary: Negative.   Skin: Negative for rash.  Neurological: Negative for dizziness, weakness and headaches.  Hematological: Does not bruise/bleed easily.  Psychiatric/Behavioral: Negative.   All other systems reviewed and are negative.      Objective:   Physical Exam Vitals signs and nursing note reviewed.  Constitutional:      Appearance: Normal appearance. He is well-developed.  HENT:     Head:  Normocephalic.     Nose: Nose normal.  Eyes:     Pupils: Pupils are equal, round, and reactive to light.  Neck:     Musculoskeletal: Normal range of motion and neck supple.     Thyroid: No thyroid mass or thyromegaly.     Vascular: No carotid bruit or JVD.     Trachea: Phonation normal.  Cardiovascular:     Rate and Rhythm: Normal rate and regular rhythm.  Pulmonary:     Effort: Pulmonary effort is normal. No respiratory distress.     Breath sounds: Normal breath sounds.  Abdominal:     General: Bowel sounds are normal.     Palpations: Abdomen is soft.     Tenderness: There is no abdominal tenderness.   Musculoskeletal: Normal range of motion.  Lymphadenopathy:     Cervical: No cervical adenopathy.  Skin:    General: Skin is warm and dry.  Neurological:     Mental Status: He is alert and oriented to person, place, and time.  Psychiatric:        Behavior: Behavior normal.        Thought Content: Thought content normal.        Judgment: Judgment normal.    BP 131/61   Pulse (!) 42   Temp 97.8 F (36.6 C) (Oral)   Resp 16   Ht '5\' 9"'  (1.753 m)   Wt 187 lb (84.8 kg)   SpO2 99%   BMI 27.62 kg/m         Assessment & Plan:  Nicolas Tyler comes in today with chief complaint of Medical Management of Chronic Issues   Diagnosis and orders addressed:  1. Essential hypertension lowsodium diet - CMP14+EGFR - lisinopril (ZESTRIL) 10 MG tablet; Take 1 tablet (10 mg total) by mouth daily.  Dispense: 90 tablet; Refill: 1  2. Hyperlipidemia with target LDL less than 100 Low fat diet - Lipid panel  3. Malignant neoplasm of prostate [facility]) Keep follow ups with urology  4. BMI 26.0-26.9,adult Discussed diet and exercise for person with BMI >25 Will recheck weight in 3-6 months   Labs pending Health Maintenance reviewed Diet and exercise encouraged  Follow up plan: 6 months   Mary-Margaret Hassell Done, FNP

## 2018-10-24 NOTE — Patient Instructions (Signed)
Exercising to Stay Healthy To become healthy and stay healthy, it is recommended that you do moderate-intensity and vigorous-intensity exercise. You can tell that you are exercising at a moderate intensity if your heart starts beating faster and you start breathing faster but can still hold a conversation. You can tell that you are exercising at a vigorous intensity if you are breathing much harder and faster and cannot hold a conversation while exercising. Exercising regularly is important. It has many health benefits, such as:  Improving overall fitness, flexibility, and endurance.  Increasing bone density.  Helping with weight control.  Decreasing body fat.  Increasing muscle strength.  Reducing stress and tension.  Improving overall health. How often should I exercise? Choose an activity that you enjoy, and set realistic goals. Your health care provider can help you make an activity plan that works for you. Exercise regularly as told by your health care provider. This may include:  Doing strength training two times a week, such as: ? Lifting weights. ? Using resistance bands. ? Push-ups. ? Sit-ups. ? Yoga.  Doing a certain intensity of exercise for a given amount of time. Choose from these options: ? A total of 150 minutes of moderate-intensity exercise every week. ? A total of 75 minutes of vigorous-intensity exercise every week. ? A mix of moderate-intensity and vigorous-intensity exercise every week. Children, pregnant women, people who have not exercised regularly, people who are overweight, and older adults may need to talk with a health care provider about what activities are safe to do. If you have a medical condition, be sure to talk with your health care provider before you start a new exercise program. What are some exercise ideas? Moderate-intensity exercise ideas include:  Walking 1 mile (1.6 km) in about 15 minutes.  Biking.  Hiking.  Golfing.  Dancing.   Water aerobics. Vigorous-intensity exercise ideas include:  Walking 4.5 miles (7.2 km) or more in about 1 hour.  Jogging or running 5 miles (8 km) in about 1 hour.  Biking 10 miles (16.1 km) or more in about 1 hour.  Lap swimming.  Roller-skating or in-line skating.  Cross-country skiing.  Vigorous competitive sports, such as football, basketball, and soccer.  Jumping rope.  Aerobic dancing. What are some everyday activities that can help me to get exercise?  Yard work, such as: ? Pushing a lawn mower. ? Raking and bagging leaves.  Washing your car.  Pushing a stroller.  Shoveling snow.  Gardening.  Washing windows or floors. How can I be more active in my day-to-day activities?  Use stairs instead of an elevator.  Take a walk during your lunch break.  If you drive, park your car farther away from your work or school.  If you take public transportation, get off one stop early and walk the rest of the way.  Stand up or walk around during all of your indoor phone calls.  Get up, stretch, and walk around every 30 minutes throughout the day.  Enjoy exercise with a friend. Support to continue exercising will help you keep a regular routine of activity. What guidelines can I follow while exercising?  Before you start a new exercise program, talk with your health care provider.  Do not exercise so much that you hurt yourself, feel dizzy, or get very short of breath.  Wear comfortable clothes and wear shoes with good support.  Drink plenty of water while you exercise to prevent dehydration or heat stroke.  Work out until your breathing   and your heartbeat get faster. Where to find more information  U.S. Department of Health and Human Services: www.hhs.gov  Centers for Disease Control and Prevention (CDC): www.cdc.gov Summary  Exercising regularly is important. It will improve your overall fitness, flexibility, and endurance.  Regular exercise also will  improve your overall health. It can help you control your weight, reduce stress, and improve your bone density.  Do not exercise so much that you hurt yourself, feel dizzy, or get very short of breath.  Before you start a new exercise program, talk with your health care provider. This information is not intended to replace advice given to you by your health care provider. Make sure you discuss any questions you have with your health care provider. Document Released: 02/25/2010 Document Revised: 01/05/2017 Document Reviewed: 12/14/2016 Elsevier Patient Education  2020 Elsevier Inc.  

## 2018-10-25 LAB — CMP14+EGFR
ALT: 17 IU/L (ref 0–44)
AST: 21 IU/L (ref 0–40)
Albumin/Globulin Ratio: 1.7 (ref 1.2–2.2)
Albumin: 4.3 g/dL (ref 3.7–4.7)
Alkaline Phosphatase: 85 IU/L (ref 39–117)
BUN/Creatinine Ratio: 22 (ref 10–24)
BUN: 20 mg/dL (ref 8–27)
Bilirubin Total: 0.4 mg/dL (ref 0.0–1.2)
CO2: 25 mmol/L (ref 20–29)
Calcium: 9.2 mg/dL (ref 8.6–10.2)
Chloride: 104 mmol/L (ref 96–106)
Creatinine, Ser: 0.93 mg/dL (ref 0.76–1.27)
GFR calc Af Amer: 93 mL/min/{1.73_m2} (ref >59)
GFR calc non Af Amer: 80 mL/min/{1.73_m2} (ref >59)
Globulin, Total: 2.5 g/dL (ref 1.5–4.5)
Glucose: 92 mg/dL (ref 65–99)
Potassium: 4.2 mmol/L (ref 3.5–5.2)
Sodium: 140 mmol/L (ref 134–144)
Total Protein: 6.8 g/dL (ref 6.0–8.5)

## 2018-10-25 LAB — LIPID PANEL
Chol/HDL Ratio: 3 ratio (ref 0.0–5.0)
Cholesterol, Total: 181 mg/dL (ref 100–199)
HDL: 60 mg/dL (ref >39)
LDL Chol Calc (NIH): 109 mg/dL — ABNORMAL HIGH (ref 0–99)
Triglycerides: 66 mg/dL (ref 0–149)
VLDL Cholesterol Cal: 12 mg/dL (ref 5–40)

## 2019-04-24 ENCOUNTER — Ambulatory Visit: Payer: Self-pay | Admitting: Nurse Practitioner

## 2019-05-27 ENCOUNTER — Telehealth: Payer: Self-pay | Admitting: Nurse Practitioner

## 2019-05-27 NOTE — Chronic Care Management (AMB) (Signed)
  Chronic Care Management   Note  05/27/2019 Name: Nicolas Tyler MRN: [id number] DOB: July 16, 1945  Nicolas Tyler is a 74 y.o. year old male who is a primary care patient of Weylin, Plagge, FNP. I reached out to [name] by phone today in response to a referral sent by Mr. Freddrick Gladson Obst's health plan.     Mr. Fedor was given information about Chronic Care Management services today including:  1. CCM service includes personalized support from designated clinical staff supervised by his physician, including individualized plan of care and coordination with other care providers 2. 24/7 contact phone numbers for assistance for urgent and routine care needs. 3. Service will only be billed when office clinical staff spend 20 minutes or more in a month to coordinate care. 4. Only one practitioner may furnish and bill the service in a calendar month. 5. The patient may stop CCM services at any time (effective at the end of the month) by phone call to the office staff. 6. The patient will be responsible for cost sharing (co-pay) of up to 20% of the service fee (after annual deductible is met).  Patient did not agree to enrollment in care management services and does not wish to consider at this time. Pateitn will be transferring care to new provider.     [place], [place] [address] Direct Dial: [phone] [email or web address] Website: [email or web address]

## 2019-07-06 ENCOUNTER — Other Ambulatory Visit: Payer: Self-pay | Admitting: Nurse Practitioner

## 2019-07-06 DIAGNOSIS — I10 Essential (primary) hypertension: Secondary | ICD-10-CM

## 2019-07-08 NOTE — Telephone Encounter (Signed)
Needs appointment for further refills

## 2019-08-02 ENCOUNTER — Other Ambulatory Visit: Payer: Self-pay | Admitting: Nurse Practitioner

## 2019-08-02 DIAGNOSIS — I10 Essential (primary) hypertension: Secondary | ICD-10-CM

## 2019-08-04 NOTE — Telephone Encounter (Signed)
Refill denied  Last seen 10/2018, cancelled appt 04/2019 30 day supply was sent on 07/01/19  Needs appt for refill

## 2019-08-05 NOTE — Telephone Encounter (Signed)
Patient states he transferred his records elsewhere 6 months ago. Sent in error by pharmacy.

## 2019-10-16 ENCOUNTER — Other Ambulatory Visit: Payer: Self-pay | Admitting: Dermatology

## 2020-01-07 ENCOUNTER — Encounter: Payer: Self-pay | Admitting: Dermatology

## 2020-01-07 ENCOUNTER — Other Ambulatory Visit: Payer: Self-pay

## 2020-01-07 ENCOUNTER — Ambulatory Visit: Payer: Medicare Other | Admitting: Dermatology

## 2020-01-07 DIAGNOSIS — C4492 Squamous cell carcinoma of skin, unspecified: Secondary | ICD-10-CM

## 2020-01-07 DIAGNOSIS — Z1283 Encounter for screening for malignant neoplasm of skin: Secondary | ICD-10-CM | POA: Diagnosis not present

## 2020-01-07 DIAGNOSIS — Z85828 Personal history of other malignant neoplasm of skin: Secondary | ICD-10-CM

## 2020-01-07 DIAGNOSIS — L570 Actinic keratosis: Secondary | ICD-10-CM | POA: Diagnosis not present

## 2020-01-07 DIAGNOSIS — C44329 Squamous cell carcinoma of skin of other parts of face: Secondary | ICD-10-CM

## 2020-01-07 DIAGNOSIS — L309 Dermatitis, unspecified: Secondary | ICD-10-CM

## 2020-01-07 DIAGNOSIS — L821 Other seborrheic keratosis: Secondary | ICD-10-CM | POA: Diagnosis not present

## 2020-01-07 DIAGNOSIS — D485 Neoplasm of uncertain behavior of skin: Secondary | ICD-10-CM

## 2020-01-07 HISTORY — DX: Squamous cell carcinoma of skin, unspecified: C44.92

## 2020-01-07 MED ORDER — BETAMETHASONE DIPROPIONATE AUG 0.05 % EX CREA: TOPICAL_CREAM | Freq: Two times a day (BID) | CUTANEOUS | 4 refills | Status: AC

## 2020-01-07 NOTE — Patient Instructions (Signed)

## 2020-01-11 ENCOUNTER — Encounter: Payer: Self-pay | Admitting: Dermatology

## 2020-01-11 NOTE — Progress Notes (Signed)
   Follow-Up Visit   Subjective  Nicolas Tyler is a 74 y.o. male who presents for the following: Annual Exam (places on cheeks looked & needs refill on Aug betasmethasone cream).  Several new crusts Location:  Duration:  Quality:  Associated Signs/Symptoms: Modifying Factors:  Severity:  Timing: Context:   Objective  Well appearing patient in no apparent distress; mood and affect are within normal limits.  All skin waist up examined.   Assessment & Plan    Encounter for screening for malignant neoplasm of skin Left Breast  Yearly skin check  History of squamous cell carcinoma of skin Head - Anterior (Face)  Yearly skin check  Seborrheic keratosis Left Lower Back  Okay to leave if stable  AK (actinic keratosis) (10) Left Forearm - Posterior (2); Mid Tip of Nose (2); Left Zygomatic Area (5); Right Zygomatic Area  Destruction of lesion - Left Forearm - Posterior (2), Right Zygomatic Area Complexity: simple   Destruction method: cryotherapy   Informed consent: discussed and consent obtained   Timeout:  patient name, date of birth, surgical site, and procedure verified Lesion destroyed using liquid nitrogen: Yes   Cryotherapy cycles:  3 Outcome: patient tolerated procedure well with no complications    Dermatitis  Ordered Medications: augmented betamethasone dipropionate (DIPROLENE-AF) 0.05 % cream  SCCA (squamous cell carcinoma) of skin Right Parotid Area  Destruction of lesion Complexity: simple   Destruction method: electrodesiccation and curettage   Informed consent: discussed and consent obtained   Timeout:  patient name, date of birth, surgical site, and procedure verified Anesthesia: the lesion was anesthetized in a standard fashion   Anesthetic:  1% lidocaine w/ epinephrine 1-100,000 local infiltration Curettage performed in three different directions: Yes   Electrodesiccation performed over the curetted area: Yes   Curettage cycles:   3 Lesion length (cm):  1 Lesion width (cm):  1 Margin per side (cm):  0 Final wound size (cm):  1 Hemostasis achieved with:  aluminum chloride Outcome: patient tolerated procedure well with no complications   Post-procedure details: wound care instructions given    Specimen 1 - Surgical pathology Differential Diagnosis: bcc vs scc Check Margins: No     I, Lavonna Monarch, MD, have reviewed all documentation for this visit.  The documentation on 01/11/20 for the exam, diagnosis, procedures, and orders are all accurate and complete.

## 2020-01-12 ENCOUNTER — Telehealth: Payer: Self-pay

## 2020-01-12 ENCOUNTER — Telehealth: Payer: Self-pay | Admitting: *Deleted

## 2020-01-12 ENCOUNTER — Encounter: Payer: Self-pay | Admitting: *Deleted

## 2020-01-12 NOTE — Telephone Encounter (Signed)
-----   Message from Lavonna Monarch, MD sent at 01/10/2020  8:48 AM EST ----- Schedule surgical with Dr. Darene Lamer but I may discuss with him at that time Mohs surgery as alternatives.

## 2020-01-12 NOTE — Telephone Encounter (Signed)
Left message for patient to call us back.  

## 2020-01-13 ENCOUNTER — Telehealth: Payer: Self-pay

## 2020-01-13 NOTE — Telephone Encounter (Signed)
Phone call from patient wanting his pathology results. Pathology results given to patient.  °

## 2020-01-13 NOTE — Telephone Encounter (Signed)
-----   Message from Lavonna Monarch, MD sent at 01/10/2020  8:48 AM EST ----- Schedule surgical with Dr. Darene Lamer but I may discuss with him at that time Mohs surgery as alternatives.

## 2020-03-04 ENCOUNTER — Other Ambulatory Visit: Payer: Self-pay

## 2020-03-04 ENCOUNTER — Ambulatory Visit (INDEPENDENT_AMBULATORY_CARE_PROVIDER_SITE_OTHER): Payer: Medicare Other | Admitting: Dermatology

## 2020-03-04 ENCOUNTER — Encounter: Payer: Self-pay | Admitting: Dermatology

## 2020-03-04 DIAGNOSIS — C44329 Squamous cell carcinoma of skin of other parts of face: Secondary | ICD-10-CM

## 2020-03-04 DIAGNOSIS — Z85828 Personal history of other malignant neoplasm of skin: Secondary | ICD-10-CM

## 2020-03-04 DIAGNOSIS — C4492 Squamous cell carcinoma of skin, unspecified: Secondary | ICD-10-CM

## 2020-03-04 NOTE — Patient Instructions (Signed)

## 2020-03-09 ENCOUNTER — Encounter: Payer: Self-pay | Admitting: Dermatology

## 2020-03-09 NOTE — Progress Notes (Signed)
   Follow-Up Visit   Subjective  Nicolas Tyler is a 75 y.o. male who presents for the following: Procedure (Here for treatment- right parotid area- scc x 1).  SCCA Location: Right cheek Duration:  Quality:  Associated Signs/Symptoms: Modifying Factors:  Severity:  Timing: Context: For treatment  Objective  Well appearing patient in no apparent distress; mood and affect are within normal limits. Objective  Left Parotid Area: Light scar- clear  Objective  Right Parotid Area: Biopsy site identified by nurse and me    A focused examination was performed including Head and neck. Relevant physical exam findings are noted in the Assessment and Plan.   Assessment & Plan    History of squamous cell carcinoma of skin Left Parotid Area  Annual recheck  Squamous cell carcinoma of skin Right Parotid Area  Destruction of lesion Complexity: simple   Destruction method: electrodesiccation and curettage   Informed consent: discussed and consent obtained   Timeout:  patient name, date of birth, surgical site, and procedure verified Anesthesia: the lesion was anesthetized in a standard fashion   Anesthetic:  1% lidocaine w/ epinephrine 1-100,000 local infiltration Curettage performed in three different directions: Yes   Curettage cycles:  3 Lesion length (cm):  1.4 Final wound size (cm):  1.4 Hemostasis achieved with:  ferric subsulfate Outcome: patient tolerated procedure well with no complications   Additional details:  Wound innoculated with 5 fluorouracil solution.      I, Lavonna Monarch, MD, have reviewed all documentation for this visit.  The documentation on 03/09/20 for the exam, diagnosis, procedures, and orders are all accurate and complete.

## 2020-03-10 ENCOUNTER — Other Ambulatory Visit: Payer: Self-pay

## 2020-03-10 ENCOUNTER — Ambulatory Visit (INDEPENDENT_AMBULATORY_CARE_PROVIDER_SITE_OTHER): Payer: Medicare Other | Admitting: *Deleted

## 2020-03-10 DIAGNOSIS — L570 Actinic keratosis: Secondary | ICD-10-CM

## 2020-03-10 MED ORDER — AMINOLEVULINIC ACID HCL 10 % EX GEL
2000.0000 mg | Freq: Once | CUTANEOUS | Status: AC
  Administered 2020-03-10: 2000 mg via TOPICAL

## 2020-03-10 NOTE — Patient Instructions (Signed)

## 2020-03-10 NOTE — Progress Notes (Addendum)
Photodynamic Therapy Procedure Note Diagnosis: Actinic keratosis Location: face Informed Consent: Discussed risks (burning, pain, redness, peeling, severe sunburn-like reaction, blistering, discoloration, lack of resolution) and benefits of the procedure, as well as the alternatives. Informed consent was obtained. Preparation: After cleansing the skin, the area to be treated was coated with Levulan.  This was allowed to sit on the skin for 90 minutes. Procedure Details: The patient was placed under the light source with appropriate eye protection for 16 minutes & 42 seconds. After completing the treatment, the patient applied sunscreen to the treated areas. Patient tolerated the procedure well Plan: Avoid any sun exposure for the next 24 hours. Wear sunscreen daily for the next week. Observe normal sun precautions thereafter. Recommend OTC analgesia as needed for pain. Follow-up in 10 weeks.

## 2020-05-25 ENCOUNTER — Other Ambulatory Visit: Payer: Self-pay

## 2020-05-25 ENCOUNTER — Ambulatory Visit: Payer: Medicare Other | Admitting: Dermatology

## 2020-05-25 DIAGNOSIS — C44329 Squamous cell carcinoma of skin of other parts of face: Secondary | ICD-10-CM

## 2020-05-25 DIAGNOSIS — L570 Actinic keratosis: Secondary | ICD-10-CM

## 2020-05-25 DIAGNOSIS — D485 Neoplasm of uncertain behavior of skin: Secondary | ICD-10-CM

## 2020-05-25 DIAGNOSIS — D0422 Carcinoma in situ of skin of left ear and external auricular canal: Secondary | ICD-10-CM | POA: Diagnosis not present

## 2020-05-25 DIAGNOSIS — C4492 Squamous cell carcinoma of skin, unspecified: Secondary | ICD-10-CM

## 2020-05-25 HISTORY — DX: Squamous cell carcinoma of skin, unspecified: C44.92

## 2020-05-31 ENCOUNTER — Telehealth: Payer: Self-pay | Admitting: *Deleted

## 2020-05-31 NOTE — Telephone Encounter (Signed)
Patient calling for pathology results. Informed him that the results aren't back yet. Call us again next week.

## 2020-06-02 ENCOUNTER — Ambulatory Visit: Payer: Medicare Other | Admitting: Dermatology

## 2020-06-02 ENCOUNTER — Telehealth: Payer: Self-pay | Admitting: *Deleted

## 2020-06-02 NOTE — Telephone Encounter (Signed)
Pathology report to patient. Surgery made with Dr. Denna Haggard.

## 2020-06-02 NOTE — Telephone Encounter (Signed)
-----   Message from Lavonna Monarch, MD sent at 06/02/2020  7:05 AM EDT ----- Schedule surgery with Dr. Darene Lamer

## 2020-06-04 ENCOUNTER — Encounter: Payer: Self-pay | Admitting: Dermatology

## 2020-06-04 NOTE — Progress Notes (Signed)
   Follow-Up Visit   Subjective  Nicolas Tyler is a 75 y.o. male who presents for the following: Follow-up (PDT-Patient had good reaction. Concerns patient does have spot left side face that I tender and comes and goes. ).  Some persistent crusts after PDT to be examined Location:  Duration:  Quality:  Associated Signs/Symptoms: Modifying Factors:  Severity:  Timing: Context:   Objective  Well appearing patient in no apparent distress; mood and affect are within normal limits. Objective  Left pre auricular: Waxy 8 mm crust with focal erosion, probable carcinoma     Objective  Left Mid Helix: Superficially eroded 6 mm ill-defined crust, rule out CIS     Objective  Left Superior Helix: Roughly 70% improvement in number of actinic keratoses after PDT, but 1 thick lesion persists on left ear.    A focused examination was performed including Head and neck.. Relevant physical exam findings are noted in the Assessment and Plan.   Assessment & Plan    Neoplasm of uncertain behavior of skin (2) Left pre auricular  Skin / nail biopsy Type of biopsy: tangential   Informed consent: discussed and consent obtained   Timeout: patient name, date of birth, surgical site, and procedure verified   Procedure prep:  Patient was prepped and draped in usual sterile fashion (Non sterile) Prep type:  Chlorhexidine Anesthesia: the lesion was anesthetized in a standard fashion   Anesthetic:  1% lidocaine w/ epinephrine 1-100,000 local infiltration Instrument used: flexible razor blade   Outcome: patient tolerated procedure well   Post-procedure details: wound care instructions given    Specimen 1 - Surgical pathology Differential Diagnosis: bcc vs scc  Check Margins: No  Left Mid Helix  Skin / nail biopsy Type of biopsy: tangential   Informed consent: discussed and consent obtained   Timeout: patient name, date of birth, surgical site, and procedure verified   Procedure  prep:  Patient was prepped and draped in usual sterile fashion (Non sterile) Prep type:  Chlorhexidine Anesthesia: the lesion was anesthetized in a standard fashion   Anesthetic:  1% lidocaine w/ epinephrine 1-100,000 local infiltration Instrument used: flexible razor blade   Outcome: patient tolerated procedure well   Post-procedure details: wound care instructions given    Specimen 2 - Surgical pathology Differential Diagnosis:bcc vs scc   Check Margins: No  AK (actinic keratosis) Left Superior Helix  Destruction of lesion - Left Superior Helix Complexity: simple   Destruction method: cryotherapy   Informed consent: discussed and consent obtained   Timeout:  patient name, date of birth, surgical site, and procedure verified Lesion destroyed using liquid nitrogen: Yes   Cryotherapy cycles:  3 Outcome: patient tolerated procedure well with no complications        I, Lavonna Monarch, MD, have reviewed all documentation for this visit.  The documentation on 06/04/20 for the exam, diagnosis, procedures, and orders are all accurate and complete.

## 2020-07-22 ENCOUNTER — Encounter: Payer: Medicare Other | Admitting: Dermatology

## 2020-09-02 ENCOUNTER — Encounter: Payer: Self-pay | Admitting: Dermatology

## 2020-09-02 ENCOUNTER — Other Ambulatory Visit: Payer: Self-pay

## 2020-09-02 ENCOUNTER — Ambulatory Visit (INDEPENDENT_AMBULATORY_CARE_PROVIDER_SITE_OTHER): Payer: Medicare Other | Admitting: Dermatology

## 2020-09-02 DIAGNOSIS — D099 Carcinoma in situ, unspecified: Secondary | ICD-10-CM

## 2020-09-02 DIAGNOSIS — C44329 Squamous cell carcinoma of skin of other parts of face: Secondary | ICD-10-CM | POA: Diagnosis not present

## 2020-09-02 NOTE — Patient Instructions (Signed)

## 2020-09-09 ENCOUNTER — Ambulatory Visit: Payer: Medicare Other

## 2020-09-22 ENCOUNTER — Encounter: Payer: Self-pay | Admitting: Dermatology

## 2020-09-22 NOTE — Progress Notes (Signed)
   Follow-Up Visit   Subjective  Nicolas Tyler is a 75 y.o. male who presents for the following: Procedure (Patient here today for treatment of SCC x 1 left preauricular and CIS x 1 left mid helix).  SCCA left cheek  Location:  Duration:  Quality:  Associated Signs/Symptoms: Modifying Factors:  Severity:  Timing: Context:   Objective  Well appearing patient in no apparent distress; mood and affect are within normal limits. Left Preauricular Area Lesion identified by Dr.Daris Aristizabal and nurse in room.      A focused examination was performed including head and neck. Relevant physical exam findings are noted in the Assessment and Plan.   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  Destruction of lesion Complexity: simple   Destruction method: electrodesiccation and curettage   Informed consent: discussed and consent obtained   Timeout:  patient name, date of birth, surgical site, and procedure verified Anesthesia: the lesion was anesthetized in a standard fashion   Anesthetic:  1% lidocaine w/ epinephrine 1-100,000 local infiltration Curettage performed in three different directions: Yes   Electrodesiccation performed over the curetted area: Yes   Curettage cycles:  3 Lesion length (cm):  1.2 Lesion width (cm):  1.2 Margin per side (cm):  0 Final wound size (cm):  1.2 Hemostasis achieved with:  ferric subsulfate Outcome: patient tolerated procedure well with no complications   Post-procedure details: sterile dressing applied and wound care instructions given   Dressing type: bandage and petrolatum   Additional details:  Wound inoculated with 5% Fluorouracil.    Skin excision  Lesion length (cm):  2 Lesion width (cm):  1.4 Margin per side (cm):  0.1 Total excision diameter (cm):  2.2 Informed consent: discussed and consent obtained   Timeout: patient name, date of birth, surgical site, and procedure verified    Anesthesia: the lesion was anesthetized in a standard fashion   Anesthetic:  1% lidocaine w/ epinephrine 1-100,000 local infiltration Instrument used: #15 blade   Hemostasis achieved with: pressure and electrodesiccation   Outcome: patient tolerated procedure well with no complications   Post-procedure details: sterile dressing applied and wound care instructions given   Dressing type: bandage, petrolatum and pressure dressing   Additional details:  5-0 x4 suture vicryl only  Specimen 1 - Surgical pathology Differential Diagnosis: well diff scc exc superior margin stained  Check Margins: No  Curettage disclosed moderate depth so the base and margins were cauterized and  re-curetted, and excised with narrow margins.      I, Lavonna Monarch, MD, have reviewed all documentation for this visit.  The documentation on 09/22/20 for the exam, diagnosis, procedures, and orders are all accurate and complete.

## 2020-11-11 ENCOUNTER — Ambulatory Visit (INDEPENDENT_AMBULATORY_CARE_PROVIDER_SITE_OTHER): Payer: Medicare Other | Admitting: Dermatology

## 2020-11-11 ENCOUNTER — Other Ambulatory Visit: Payer: Self-pay

## 2020-11-11 DIAGNOSIS — D099 Carcinoma in situ, unspecified: Secondary | ICD-10-CM

## 2020-11-11 DIAGNOSIS — D0422 Carcinoma in situ of skin of left ear and external auricular canal: Secondary | ICD-10-CM | POA: Diagnosis not present

## 2020-11-11 DIAGNOSIS — D485 Neoplasm of uncertain behavior of skin: Secondary | ICD-10-CM

## 2020-11-11 NOTE — Patient Instructions (Signed)

## 2020-11-28 ENCOUNTER — Encounter: Payer: Self-pay | Admitting: Dermatology

## 2020-11-28 NOTE — Progress Notes (Signed)
   Follow-Up Visit   Subjective  Nicolas Tyler is a 75 y.o. male who presents for the following: Procedure (Here for treatment CIS left mid helix.).  Biopsy proven superficial carcinoma left ear plus second crust on same area Location:  Duration:  Quality:  Associated Signs/Symptoms: Modifying Factors:  Severity:  Timing: Context:   Objective  Well appearing patient in no apparent distress; mood and affect are within normal limits. Left mid helix Lesion identified by Dr.Adlynn Lowenstein and nurse in room.    Left Superior Crus of Antihelix Second waxy extremely millimeter crust       A focused examination was performed including head and neck. Relevant physical exam findings are noted in the Assessment and Plan.   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  Destruction of lesion Complexity: simple   Destruction method: electrodesiccation and curettage   Informed consent: discussed and consent obtained   Timeout:  patient name, date of birth, surgical site, and procedure verified Anesthesia: the lesion was anesthetized in a standard fashion   Anesthetic:  1% lidocaine w/ epinephrine 1-100,000 local infiltration Curettage performed in three different directions: Yes   Curettage cycles:  3 Lesion length (cm):  1.2 Lesion width (cm):  1.2 Margin per side (cm):  0 Final wound size (cm):  1.2 Hemostasis achieved with:  ferric subsulfate Outcome: patient tolerated procedure well with no complications   Additional details:  Wound innoculated with 5 fluorouracil solution.  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  Skin / nail biopsy Type of biopsy: tangential   Informed consent: discussed and consent obtained   Timeout: patient name, date of birth, surgical site, and procedure verified   Procedure prep:  Patient was prepped and draped in usual sterile fashion (Non sterile) Prep type:  Chlorhexidine Anesthesia: the  lesion was anesthetized in a standard fashion   Anesthetic:  1% lidocaine w/ epinephrine 1-100,000 local infiltration Instrument used: flexible razor blade   Outcome: patient tolerated procedure well   Post-procedure details: wound care instructions given    Destruction of lesion Complexity: simple   Destruction method: electrodesiccation and curettage   Informed consent: discussed and consent obtained   Timeout:  patient name, date of birth, surgical site, and procedure verified Anesthesia: the lesion was anesthetized in a standard fashion   Anesthetic:  1% lidocaine w/ epinephrine 1-100,000 local infiltration Curettage performed in three different directions: Yes   Electrodesiccation performed over the curetted area: Yes   Curettage cycles:  3 Lesion length (cm):  2 Lesion width (cm):  2 Margin per side (cm):  0 Final wound size (cm):  2 Hemostasis achieved with:  ferric subsulfate Outcome: patient tolerated procedure well with no complications   Additional details:  Wound innoculated with 5 fluorouracil solution.  Specimen 1 - Surgical pathology Differential Diagnosis: bcc scc tx with bx  Check Margins: No  After shave biopsy the base was treated with curettage plus cautery      I, Lavonna Monarch, MD, have reviewed all documentation for this visit.  The documentation on 11/28/20 for the exam, diagnosis, procedures, and orders are all accurate and complete.

## 2021-04-11 ENCOUNTER — Encounter: Payer: Self-pay | Admitting: Dermatology

## 2021-04-11 ENCOUNTER — Other Ambulatory Visit: Payer: Self-pay

## 2021-04-11 ENCOUNTER — Ambulatory Visit: Payer: Medicare Other | Admitting: Dermatology

## 2021-04-11 DIAGNOSIS — L570 Actinic keratosis: Secondary | ICD-10-CM | POA: Diagnosis not present

## 2021-04-11 DIAGNOSIS — Z1283 Encounter for screening for malignant neoplasm of skin: Secondary | ICD-10-CM

## 2021-04-11 DIAGNOSIS — Z85828 Personal history of other malignant neoplasm of skin: Secondary | ICD-10-CM | POA: Diagnosis not present

## 2021-04-11 DIAGNOSIS — L821 Other seborrheic keratosis: Secondary | ICD-10-CM | POA: Diagnosis not present

## 2021-04-17 ENCOUNTER — Encounter: Payer: Self-pay | Admitting: Dermatology

## 2021-04-17 NOTE — Progress Notes (Signed)
° °  Follow-Up Visit   Subjective  Nicolas Tyler is a 76 y.o. male who presents for the following: Follow-up (F/u left superior crus of antihelix & left mid helx- cis - healing good no concerns. Left sideburn- stays crusty and tender ).  Check sites of skin cancer plus several other spots Location:  Duration:  Quality:  Associated Signs/Symptoms: Modifying Factors:  Severity:  Timing: Context:   Objective  Well appearing patient in no apparent distress; mood and affect are within normal limits. No atypical nevi or signs of NMSC noted at the time of the visit.   Head - Anterior (Face) (12) Erythematous patches with gritty scale.  Chest - Medial (Center) Textured brown 5 mm flattopped papule  Left Ear No sign residual carcinoma in situ    All skin waist up examined.   Assessment & Plan    AK (actinic keratosis) (12) Head - Anterior (Face)  Destruction of lesion - Head - Anterior (Face) Complexity: simple   Destruction method: cryotherapy   Informed consent: discussed and consent obtained   Timeout:  patient name, date of birth, surgical site, and procedure verified Lesion destroyed using liquid nitrogen: Yes   Cryotherapy cycles:  3 Outcome: patient tolerated procedure well with no complications    Seborrheic keratosis Chest - Medial (Center)  Okay to leave if stable  Encounter for screening for malignant neoplasm of skin  Yearly skin examination   Personal history of skin cancer Left Ear  Check if clinical change      I, Lavonna Monarch, MD, have reviewed all documentation for this visit.  The documentation on 04/17/21 for the exam, diagnosis, procedures, and orders are all accurate and complete.

## 2022-04-12 ENCOUNTER — Ambulatory Visit: Payer: Medicare Other | Admitting: Dermatology

## 2022-06-06 ENCOUNTER — Encounter: Payer: Self-pay | Admitting: Internal Medicine
